# Patient Record
Sex: Male | Born: 1945 | Race: Black or African American | Hispanic: No | Marital: Single | Smoking: Never smoker
Health system: Southern US, Community
[De-identification: ages and names within clinical notes are randomized; demographics above are authoritative.]

## PROBLEM LIST (undated history)

## (undated) DIAGNOSIS — I82409 Acute embolism and thrombosis of unspecified deep veins of unspecified lower extremity: Secondary | ICD-10-CM

## (undated) DIAGNOSIS — E785 Hyperlipidemia, unspecified: Secondary | ICD-10-CM

## (undated) DIAGNOSIS — C61 Malignant neoplasm of prostate: Secondary | ICD-10-CM

## (undated) DIAGNOSIS — I10 Essential (primary) hypertension: Secondary | ICD-10-CM

## (undated) DIAGNOSIS — E119 Type 2 diabetes mellitus without complications: Secondary | ICD-10-CM

## (undated) DIAGNOSIS — K449 Diaphragmatic hernia without obstruction or gangrene: Secondary | ICD-10-CM

## (undated) HISTORY — PX: KIDNEY STONE SURGERY: SHX686

## (undated) HISTORY — PX: PROSTATE SURGERY: SHX751

## (undated) HISTORY — PX: THROAT SURGERY: SHX803

## (undated) HISTORY — PX: PENILE PROSTHESIS IMPLANT: SHX240

## (undated) HISTORY — PX: CYST EXCISION: SHX5701

---

## 2012-10-03 ENCOUNTER — Encounter (HOSPITAL_BASED_OUTPATIENT_CLINIC_OR_DEPARTMENT_OTHER): Payer: Self-pay

## 2012-10-03 ENCOUNTER — Emergency Department (HOSPITAL_BASED_OUTPATIENT_CLINIC_OR_DEPARTMENT_OTHER)
Admission: EM | Admit: 2012-10-03 | Discharge: 2012-10-03 | Disposition: A | Discharge status: Home / Self Care | Payer: Medicare Other | Attending: Emergency Medicine | Admitting: Emergency Medicine

## 2012-10-03 DIAGNOSIS — Z79899 Other long term (current) drug therapy: Secondary | ICD-10-CM | POA: Insufficient documentation

## 2012-10-03 DIAGNOSIS — I10 Essential (primary) hypertension: Secondary | ICD-10-CM | POA: Insufficient documentation

## 2012-10-03 DIAGNOSIS — Z8546 Personal history of malignant neoplasm of prostate: Secondary | ICD-10-CM | POA: Insufficient documentation

## 2012-10-03 DIAGNOSIS — R300 Dysuria: Secondary | ICD-10-CM | POA: Insufficient documentation

## 2012-10-03 DIAGNOSIS — Z86718 Personal history of other venous thrombosis and embolism: Secondary | ICD-10-CM | POA: Insufficient documentation

## 2012-10-03 DIAGNOSIS — Z8639 Personal history of other endocrine, nutritional and metabolic disease: Secondary | ICD-10-CM | POA: Insufficient documentation

## 2012-10-03 DIAGNOSIS — Z8719 Personal history of other diseases of the digestive system: Secondary | ICD-10-CM | POA: Insufficient documentation

## 2012-10-03 DIAGNOSIS — Z862 Personal history of diseases of the blood and blood-forming organs and certain disorders involving the immune mechanism: Secondary | ICD-10-CM | POA: Insufficient documentation

## 2012-10-03 DIAGNOSIS — Z87442 Personal history of urinary calculi: Secondary | ICD-10-CM | POA: Insufficient documentation

## 2012-10-03 DIAGNOSIS — Z7901 Long term (current) use of anticoagulants: Secondary | ICD-10-CM | POA: Insufficient documentation

## 2012-10-03 HISTORY — DX: Essential (primary) hypertension: I10

## 2012-10-03 HISTORY — DX: Malignant neoplasm of prostate: C61

## 2012-10-03 HISTORY — DX: Hyperlipidemia, unspecified: E78.5

## 2012-10-03 HISTORY — DX: Acute embolism and thrombosis of unspecified deep veins of unspecified lower extremity: I82.409

## 2012-10-03 HISTORY — DX: Diaphragmatic hernia without obstruction or gangrene: K44.9

## 2012-10-03 LAB — URINALYSIS, ROUTINE W REFLEX MICROSCOPIC
Bilirubin Urine: NEGATIVE
Glucose, UA: NEGATIVE mg/dL
Hgb urine dipstick: NEGATIVE
Ketones, ur: NEGATIVE mg/dL
Nitrite: NEGATIVE
Specific Gravity, Urine: 1.007 (ref 1.005–1.030)
pH: 6 (ref 5.0–8.0)

## 2012-10-03 NOTE — ED Provider Notes (Signed)
Medical screening examination/treatment/procedure(s) were performed by non-physician practitioner and as supervising physician I was immediately available for consultation/collaboration.   Loren Racer, MD 10/03/12 [phone]

## 2012-10-03 NOTE — ED Notes (Signed)
Pt states that he has undergone radiation treatments for prostate ca, has been having dysuria with end of urination.  Pt states that he has been seen by PCP but symptoms are not improving.

## 2012-10-03 NOTE — ED Provider Notes (Signed)
History     CSN: [id number]  Arrival date & time 10/03/12  1123   First MD Initiated Contact with Patient 10/03/12 1208      Chief Complaint  Patient presents with  . Dysuria    (Consider location/radiation/quality/duration/timing/severity/associated sxs/prior treatment) Patient is a 67 y.o. male presenting with dysuria. The history is provided by the patient. No language interpreter was used.  Dysuria  This is a new problem. The current episode started more than 1 week ago. The problem has not changed since onset.The quality of the pain is described as aching. The pain is moderate. There has been no fever. There is no history of pyelonephritis. Pertinent negatives include no vomiting, no discharge, no frequency, no urgency and no flank pain. He has tried nothing for the symptoms. His past medical history is significant for kidney stones. His past medical history does not include recurrent UTIs.  Pt had radiation treatments for prostate ca.   Pt reports a stinging sensation at the end of urination.  No retention,  No discharge.  Past Medical History  Diagnosis Date  . Prostate cancer   . Hypertension   . Hyperlipidemia   . Hiatal hernia   . DVT (deep venous thrombosis)     Past Surgical History  Procedure Laterality Date  . Prostate surgery    . Cyst excision      from vocal cords    History reviewed. No pertinent family history.  History  Substance Use Topics  . Smoking status: Never Smoker   . Smokeless tobacco: Never Used  . Alcohol Use: No      Review of Systems  Gastrointestinal: Negative for vomiting.  Genitourinary: Positive for dysuria. Negative for urgency, frequency and flank pain.  All other systems reviewed and are negative.    Allergies  Review of patient's allergies indicates no known allergies.  Home Medications   Current Outpatient Rx  Name  Route  Sig  Dispense  Refill  . chlorthalidone (HYGROTON) 25 MG tablet   Oral   Take 25 mg by  mouth daily.         Marland Kitchen diltiazem (CARDIZEM) 90 MG tablet   Oral   Take 90 mg by mouth 2 (two) times daily.         Marland Kitchen esomeprazole (NEXIUM) 40 MG capsule   Oral   Take 40 mg by mouth 2 (two) times daily.         . fish oil-omega-3 fatty acids 1000 MG capsule   Oral   Take 2 g by mouth daily.         Marland Kitchen loratadine (CLARITIN) 10 MG tablet   Oral   Take 10 mg by mouth daily.         . metoprolol (LOPRESSOR) 100 MG tablet   Oral   Take 100 mg by mouth 2 (two) times daily.         . potassium chloride SA (K-DUR,KLOR-CON) 20 MEQ tablet   Oral   Take 40 mEq by mouth 2 (two) times daily.         . Tamsulosin HCl (FLOMAX) 0.4 MG CAPS   Oral   Take 0.4 mg by mouth at bedtime.         . traMADol (ULTRAM) 50 MG tablet   Oral   Take 50 mg by mouth every 8 (eight) hours as needed for pain.         Marland Kitchen warfarin (COUMADIN) 5 MG tablet   Oral  Take by mouth daily. Take one and half tablets Monday, Wednesday, and Friday, then one tablet all other days.           BP 134/83  Pulse 54  Temp(Src) 97.9 F (36.6 C) (Oral)  Ht 5\' 11"  (1.803 m)  Wt 244 lb (110.678 kg)  BMI 34.05 kg/m2  SpO2 99%  Physical Exam  Nursing note and vitals reviewed. Constitutional: He is oriented to person, place, and time. He appears well-developed and well-nourished.  HENT:  Head: Normocephalic.  Right Ear: External ear normal.  Left Ear: External ear normal.  Nose: Nose normal.  Mouth/Throat: Oropharynx is clear and moist.  Eyes: Conjunctivae and EOM are normal. Pupils are equal, round, and reactive to light.  Neck: Normal range of motion. Neck supple.  Cardiovascular: Normal rate and normal heart sounds.   Pulmonary/Chest: Effort normal.  Abdominal: Soft.  Musculoskeletal: Normal range of motion.  Neurological: He is alert and oriented to person, place, and time. He has normal reflexes.  Skin: Skin is warm.  Psychiatric: He has a normal mood and affect.    ED Course   Procedures (including critical care time)  Labs Reviewed  URINALYSIS, ROUTINE W REFLEX MICROSCOPIC   No results found.   1. Dysuria       MDM  I discussed with pt,  No uti,   I advised him to see his MD this week for recheck.  (I suspect nerve pain,   I advised pt that there is some pain with nerve healing after raditation)        Elson Areas, PA 10/03/12 1317  Lonia Skinner [place], [place] 10/03/12 1317

## 2013-06-03 ENCOUNTER — Ambulatory Visit (INDEPENDENT_AMBULATORY_CARE_PROVIDER_SITE_OTHER): Payer: PRIVATE HEALTH INSURANCE | Admitting: Podiatry

## 2013-06-03 ENCOUNTER — Encounter: Payer: Self-pay | Admitting: Podiatry

## 2013-06-03 VITALS — BP 115/81 | HR 65 | Ht 71.0 in | Wt 223.0 lb

## 2013-06-03 DIAGNOSIS — M79671 Pain in right foot: Secondary | ICD-10-CM

## 2013-06-03 DIAGNOSIS — M21969 Unspecified acquired deformity of unspecified lower leg: Secondary | ICD-10-CM

## 2013-06-03 DIAGNOSIS — M79609 Pain in unspecified limb: Secondary | ICD-10-CM

## 2013-06-03 NOTE — Progress Notes (Signed)
Pain on left foot arch area for over 2 weeks. On feet while working part time work, 4 hours a day.  Taking Coumadin since having blood clot on left leg.  Review of Systems - General ROS: negative for - chills, fatigue, fever, night sweats, sleep disturbance, weight gain or weight loss Ophthalmic ROS: negative ENT ROS: Has had cyst removed from vocal cord 3 years ago. Respiratory ROS: no cough, shortness of breath, or wheezing Cardiovascular ROS: no chest pain or dyspnea on exertion Gastrointestinal ROS: no abdominal pain, change in bowel habits, or black or bloody stools Genito-Urinary ROS: Recovered from Prostate cancer. Musculoskeletal ROS: Left knee joint is tight. Dermatological ROS: Normal skin excetp thick dystrophic nails on both great toes.  Objective: Vascular: All pedal pulses are palpable. Left leg is still slightly larger, which had blood clot 3 years ago.  No acute edema or erythema noted.  Dermatologic: Normotrophic skin with deformed hallucal nail bilateral. No open lesions noted. Neurologic: All epicritic and tactile sensations grossly intact. Orthopedic: Hallux valgus with bunion present bilateral. Forefoot varus with elevated first ray. Lateral weight shifting on left with joint pain. Radiographic examination reveal severe dorsal displacement of the first ray bilateral. Abducted hallux with enlarged medial eminence of the first Metatarsal bones bilateral.  Assessment: Lesser metatarsalgia left foot. Hallux valgus with bunion bilateral. Sagittal plane deformity first Metatarsal bilateral.  Plan:  Reviewed clinical findings and available treatment options. Will continue with Tramadol.

## 2013-06-03 NOTE — Patient Instructions (Addendum)
Seen for pain on left foot. X-ray show severe case of misalignment of foot bones. Condition can be helped by taking Antiinflammatory medication like Advil, custom orthotics, and changing activities (less on feet). Surgical option is also available. Continue with Tramadol as needed.

## 2013-06-07 DIAGNOSIS — M21969 Unspecified acquired deformity of unspecified lower leg: Secondary | ICD-10-CM | POA: Insufficient documentation

## 2013-06-07 DIAGNOSIS — M79671 Pain in right foot: Secondary | ICD-10-CM | POA: Insufficient documentation

## 2014-07-12 ENCOUNTER — Emergency Department (HOSPITAL_BASED_OUTPATIENT_CLINIC_OR_DEPARTMENT_OTHER): Payer: Medicare Other

## 2014-07-12 ENCOUNTER — Emergency Department (HOSPITAL_BASED_OUTPATIENT_CLINIC_OR_DEPARTMENT_OTHER)
Admission: EM | Admit: 2014-07-12 | Discharge: 2014-07-12 | Disposition: A | Discharge status: Home / Self Care | Payer: Medicare Other | Attending: Emergency Medicine | Admitting: Emergency Medicine

## 2014-07-12 ENCOUNTER — Encounter (HOSPITAL_BASED_OUTPATIENT_CLINIC_OR_DEPARTMENT_OTHER): Payer: Self-pay | Admitting: Emergency Medicine

## 2014-07-12 DIAGNOSIS — Z86718 Personal history of other venous thrombosis and embolism: Secondary | ICD-10-CM | POA: Insufficient documentation

## 2014-07-12 DIAGNOSIS — E785 Hyperlipidemia, unspecified: Secondary | ICD-10-CM | POA: Diagnosis not present

## 2014-07-12 DIAGNOSIS — Z9889 Other specified postprocedural states: Secondary | ICD-10-CM | POA: Diagnosis not present

## 2014-07-12 DIAGNOSIS — Z7901 Long term (current) use of anticoagulants: Secondary | ICD-10-CM | POA: Diagnosis not present

## 2014-07-12 DIAGNOSIS — R1084 Generalized abdominal pain: Secondary | ICD-10-CM | POA: Diagnosis not present

## 2014-07-12 DIAGNOSIS — I10 Essential (primary) hypertension: Secondary | ICD-10-CM | POA: Insufficient documentation

## 2014-07-12 DIAGNOSIS — K5900 Constipation, unspecified: Secondary | ICD-10-CM | POA: Insufficient documentation

## 2014-07-12 DIAGNOSIS — Z7952 Long term (current) use of systemic steroids: Secondary | ICD-10-CM | POA: Insufficient documentation

## 2014-07-12 DIAGNOSIS — Z8546 Personal history of malignant neoplasm of prostate: Secondary | ICD-10-CM | POA: Insufficient documentation

## 2014-07-12 DIAGNOSIS — Z79899 Other long term (current) drug therapy: Secondary | ICD-10-CM | POA: Diagnosis not present

## 2014-07-12 DIAGNOSIS — R109 Unspecified abdominal pain: Secondary | ICD-10-CM

## 2014-07-12 LAB — CBC WITH DIFFERENTIAL/PLATELET
BASOS ABS: 0 10*3/uL (ref 0.0–0.1)
Basophils Relative: 0 % (ref 0–1)
EOS PCT: 1 % (ref 0–5)
Eosinophils Absolute: 0 10*3/uL (ref 0.0–0.7)
HCT: 34.9 % — ABNORMAL LOW (ref 39.0–52.0)
Hemoglobin: 10.9 g/dL — ABNORMAL LOW (ref 13.0–17.0)
LYMPHS PCT: 22 % (ref 12–46)
Lymphs Abs: 0.8 10*3/uL (ref 0.7–4.0)
MCH: 24.7 pg — ABNORMAL LOW (ref 26.0–34.0)
MCHC: 31.2 g/dL (ref 30.0–36.0)
MCV: 79.1 fL (ref 78.0–100.0)
Monocytes Absolute: 0.4 10*3/uL (ref 0.1–1.0)
Monocytes Relative: 11 % (ref 3–12)
NEUTROS ABS: 2.4 10*3/uL (ref 1.7–7.7)
Neutrophils Relative %: 66 % (ref 43–77)
PLATELETS: 128 10*3/uL — AB (ref 150–400)
RBC: 4.41 MIL/uL (ref 4.22–5.81)
RDW: 14.9 % (ref 11.5–15.5)
WBC: 3.7 10*3/uL — AB (ref 4.0–10.5)

## 2014-07-12 LAB — COMPREHENSIVE METABOLIC PANEL
ALBUMIN: 3.3 g/dL — AB (ref 3.5–5.2)
ALT: 13 U/L (ref 0–53)
AST: 17 U/L (ref 0–37)
Alkaline Phosphatase: 71 U/L (ref 39–117)
Anion gap: 9 (ref 5–15)
BUN: 8 mg/dL (ref 6–23)
CALCIUM: 8.3 mg/dL — AB (ref 8.4–10.5)
CO2: 26 meq/L (ref 19–32)
Chloride: 104 mEq/L (ref 96–112)
Creatinine, Ser: 1.4 mg/dL — ABNORMAL HIGH (ref 0.50–1.35)
GFR calc Af Amer: 58 mL/min — ABNORMAL LOW (ref >90)
GFR, EST NON AFRICAN AMERICAN: 50 mL/min — AB (ref >90)
Glucose, Bld: 111 mg/dL — ABNORMAL HIGH (ref 70–99)
Potassium: 4.6 mEq/L (ref 3.7–5.3)
SODIUM: 139 meq/L (ref 137–147)
Total Bilirubin: 0.6 mg/dL (ref 0.3–1.2)
Total Protein: 6.8 g/dL (ref 6.0–8.3)

## 2014-07-12 LAB — URINALYSIS, ROUTINE W REFLEX MICROSCOPIC
Bilirubin Urine: NEGATIVE
GLUCOSE, UA: NEGATIVE mg/dL
Hgb urine dipstick: NEGATIVE
KETONES UR: NEGATIVE mg/dL
LEUKOCYTES UA: NEGATIVE
Nitrite: NEGATIVE
PH: 6 (ref 5.0–8.0)
Protein, ur: NEGATIVE mg/dL
SPECIFIC GRAVITY, URINE: 1.021 (ref 1.005–1.030)
Urobilinogen, UA: 0.2 mg/dL (ref 0.0–1.0)

## 2014-07-12 LAB — LIPASE, BLOOD: Lipase: 31 U/L (ref 11–59)

## 2014-07-12 LAB — TROPONIN I

## 2014-07-12 LAB — I-STAT CG4 LACTIC ACID, ED: Lactic Acid, Venous: 1.07 mmol/L (ref 0.5–2.2)

## 2014-07-12 LAB — PROTIME-INR
INR: 2.69 — AB (ref 0.00–1.49)
PROTHROMBIN TIME: 28.6 s — AB (ref 11.6–15.2)

## 2014-07-12 LAB — OCCULT BLOOD X 1 CARD TO LAB, STOOL: FECAL OCCULT BLD: NEGATIVE

## 2014-07-12 MED ORDER — DOCUSATE SODIUM 100 MG PO CAPS: 100.0000 mg | ORAL_CAPSULE | Freq: Two times a day (BID) | ORAL | Status: AC

## 2014-07-12 MED ORDER — IOHEXOL 300 MG/ML  SOLN
25.0000 mL | Freq: Once | INTRAMUSCULAR | Status: AC | PRN
  Administered 2014-07-12: 25 mL via ORAL

## 2014-07-12 MED ORDER — IOHEXOL 300 MG/ML  SOLN
100.0000 mL | Freq: Once | INTRAMUSCULAR | Status: AC | PRN
  Administered 2014-07-12: 100 mL via INTRAVENOUS

## 2014-07-12 MED ORDER — DICYCLOMINE HCL 20 MG PO TABS: 20.0000 mg | ORAL_TABLET | Freq: Two times a day (BID) | ORAL | Status: AC

## 2014-07-12 NOTE — Discharge Instructions (Signed)

## 2014-07-12 NOTE — ED Notes (Signed)
Pt made aware that urine specimen is needed at this time.

## 2014-07-12 NOTE — ED Notes (Signed)
Patient transported to CT 

## 2014-07-12 NOTE — ED Notes (Signed)
Abdominal pain since last night.  No N/V/D.  Pt feels constipated.

## 2014-07-12 NOTE — ED Provider Notes (Signed)
CSN: [id number]     Arrival date & time 07/12/14  8416 History   First MD Initiated Contact with Patient 07/12/14 0848     Chief Complaint  Patient presents with  . Abdominal Pain     (Consider location/radiation/quality/duration/timing/severity/associated sxs/prior Treatment) HPI Comments: Patient presents to the ER for evaluation of abdominal pain and distention. Patient reports that he started to have discomfort last night. He feels like he is constipated, feels like he needs to have a bowel movement but cannot go. Abdomen is "bloated". He has not had any fever, nausea, vomiting. There has not been any diarrhea.  Patient is a 68 y.o. male presenting with abdominal pain.  Abdominal Pain Associated symptoms: constipation     Past Medical History  Diagnosis Date  . Prostate cancer   . Hypertension   . Hyperlipidemia   . Hiatal hernia   . DVT (deep venous thrombosis)    Past Surgical History  Procedure Laterality Date  . Prostate surgery    . Cyst excision      from vocal cords   No family history on file. History  Substance Use Topics  . Smoking status: Never Smoker   . Smokeless tobacco: Never Used  . Alcohol Use: No    Review of Systems  Gastrointestinal: Positive for abdominal pain, constipation and abdominal distention.  All other systems reviewed and are negative.     Allergies  Review of patient's allergies indicates no known allergies.  Home Medications   Prior to Admission medications   Medication Sig Start Date End Date Taking? Authorizing Provider  atorvastatin (LIPITOR) 10 MG tablet Take 10 mg by mouth daily.   Yes Historical Provider, MD  Cetirizine HCl 10 MG CAPS Take 1 capsule by mouth daily.   Yes Historical Provider, MD  chlorthalidone (HYGROTON) 25 MG tablet Take 25 mg by mouth daily.   Yes Historical Provider, MD  cyanocobalamin 500 MCG tablet Take 500 mcg by mouth daily.   Yes Historical Provider, MD  esomeprazole (NEXIUM) 40 MG capsule  Take 40 mg by mouth 2 (two) times daily.   Yes Historical Provider, MD  fluticasone (FLONASE) 50 MCG/ACT nasal spray Place 2 sprays into both nostrils daily.   Yes Historical Provider, MD  metoprolol (LOPRESSOR) 100 MG tablet Take 100 mg by mouth 2 (two) times daily.   Yes Historical Provider, MD  oxyCODONE-acetaminophen (PERCOCET) 10-325 MG per tablet Take 1 tablet by mouth every 6 (six) hours as needed for pain.   Yes Historical Provider, MD  potassium chloride SA (K-DUR,KLOR-CON) 20 MEQ tablet Take 40 mEq by mouth 2 (two) times daily.   Yes Historical Provider, MD  traMADol (ULTRAM) 50 MG tablet Take 50 mg by mouth every 8 (eight) hours as needed for pain.   Yes Historical Provider, MD  warfarin (COUMADIN) 5 MG tablet Take by mouth daily. Take one and half tablets Monday, Wednesday, and Friday, then one tablet all other days.   Yes Historical Provider, MD  dicyclomine (BENTYL) 20 MG tablet Take 1 tablet (20 mg total) by mouth 2 (two) times daily. 07/12/14   Orpah Greek, MD  docusate sodium (COLACE) 100 MG capsule Take 1 capsule (100 mg total) by mouth every 12 (twelve) hours. 07/12/14   Orpah Greek, MD   BP 152/76 mmHg  Pulse 52  Temp(Src) 98.6 F (37 C) (Oral)  Resp 16  Ht 5\' 11"  (1.803 m)  Wt 248 lb (112.492 kg)  BMI 34.60 kg/m2  SpO2 100% Physical  Exam  Constitutional: He is oriented to person, place, and time. He appears well-developed and well-nourished. No distress.  HENT:  Head: Normocephalic and atraumatic.  Right Ear: Hearing normal.  Left Ear: Hearing normal.  Nose: Nose normal.  Mouth/Throat: Oropharynx is clear and moist and mucous membranes are normal.  Eyes: Conjunctivae and EOM are normal. Pupils are equal, round, and reactive to light.  Neck: Normal range of motion. Neck supple.  Cardiovascular: Regular rhythm, S1 normal and S2 normal.  Exam reveals no gallop and no friction rub.   No murmur heard. Pulmonary/Chest: Effort normal and breath  sounds normal. No respiratory distress. He exhibits no tenderness.  Abdominal: Soft. Normal appearance and bowel sounds are normal. He exhibits distension. There is no hepatosplenomegaly. There is generalized tenderness. There is no rebound, no guarding, no tenderness at McBurney's point and negative Murphy's sign. No hernia.  Genitourinary: Rectal exam shows no external hemorrhoid, no internal hemorrhoid, no mass, no tenderness and anal tone normal.  Musculoskeletal: Normal range of motion.  Neurological: He is alert and oriented to person, place, and time. He has normal strength. No cranial nerve deficit or sensory deficit. Coordination normal. GCS eye subscore is 4. GCS verbal subscore is 5. GCS motor subscore is 6.  Skin: Skin is warm, dry and intact. No rash noted. No cyanosis.  Psychiatric: He has a normal mood and affect. His speech is normal and behavior is normal. Thought content normal.  Nursing note and vitals reviewed.   ED Course  Procedures (including critical care time) Labs Review Labs Reviewed  CBC WITH DIFFERENTIAL - Abnormal; Notable for the following:    WBC 3.7 (*)    Hemoglobin 10.9 (*)    HCT 34.9 (*)    MCH 24.7 (*)    Platelets 128 (*)    All other components within normal limits  COMPREHENSIVE METABOLIC PANEL - Abnormal; Notable for the following:    Glucose, Bld 111 (*)    Creatinine, Ser 1.40 (*)    Calcium 8.3 (*)    Albumin 3.3 (*)    GFR calc non Af Amer 50 (*)    GFR calc Af Amer 58 (*)    All other components within normal limits  PROTIME-INR - Abnormal; Notable for the following:    Prothrombin Time 28.6 (*)    INR 2.69 (*)    All other components within normal limits  LIPASE, BLOOD  URINALYSIS, ROUTINE W REFLEX MICROSCOPIC  TROPONIN I  OCCULT BLOOD X 1 CARD TO LAB, STOOL  LACTIC ACID, PLASMA  I-STAT CG4 LACTIC ACID, ED    Imaging Review Ct Abdomen Pelvis W Contrast  07/12/2014   CLINICAL DATA:  Upper abdominal pain since 1 am, initial  encounter  EXAM: CT ABDOMEN AND PELVIS WITH CONTRAST  TECHNIQUE: Multidetector CT imaging of the abdomen and pelvis was performed using the standard protocol following bolus administration of intravenous contrast.  CONTRAST:  178mL OMNIPAQUE IOHEXOL 300 MG/ML SOLN, 5mL OMNIPAQUE IOHEXOL 300 MG/ML SOLN  COMPARISON:  03/12/2013  FINDINGS: The lung bases are free of acute infiltrate or sizable effusion. Stable nodules are noted in the right base consistent with a more benign etiology. A small hiatal hernia is noted.  The liver, spleen, adrenal glands and kidneys are within normal limits. The gallbladder is been surgically removed. The pancreas is well visualized and within normal limits. Multiple small lymph nodes and diffuse mesenteric edema is identified. The overall appearance is similar to that seen on prior exam as well  an exam from 2011.  The penile prosthesis is again noted with the reservoir in the left mid abdomen. The appendix is well visualized. The bladder is well distended. The previously seen postoperative changes in the pelvis have resolved. Soft tissue density is again noted adjacent to the left common femoral artery measuring 2.6 by 2.3 cm. It is stable since 2011 and likely represents a more benign abnormality. No significant pelvic or retroperitoneal adenopathy is seen. The bony structures show no acute abnormality.  IMPRESSION: Stable right lower lobe nodules.  Stable edematous changes in the mesenteric with small lymph nodes.  Stable soft tissue density adjacent to the left common femoral artery.  No acute abnormality is seen.   Electronically Signed   By: Inez Catalina M.D.   On: 07/12/2014 11:11     EKG Interpretation   Date/Time:  Wednesday July 12 2014 09:09:44 EST Ventricular Rate:  52 PR Interval:  236 QRS Duration: 86 QT Interval:  418 QTC Calculation: 388 R Axis:   -2 Text Interpretation:  Sinus bradycardia with 1st degree A-V block  Nonspecific T wave abnormality Abnormal  ECG No previous tracing Confirmed  by POLLINA  MD, CHRISTOPHER [phone]) on 07/12/2014 9:14:12 AM      MDM   Final diagnoses:  Abdominal pain, acute    Presents to the ER for evaluation of abdominal pain. Patient reports inability to have a bowel movement since yesterday. He has mild distention of his abdomen. Exam revealed no significant tenderness, no guarding or rebound. Lab work was entirely normal. CT abdomen and pelvis performed, no acute pathology. Patient treated with Bentyl, Colace, follow-up with primary doctor.   Orpah Greek, MD 07/12/14 [phone]

## 2015-05-21 ENCOUNTER — Encounter (HOSPITAL_BASED_OUTPATIENT_CLINIC_OR_DEPARTMENT_OTHER): Payer: Self-pay | Admitting: Emergency Medicine

## 2015-05-21 ENCOUNTER — Emergency Department (HOSPITAL_BASED_OUTPATIENT_CLINIC_OR_DEPARTMENT_OTHER)
Admission: EM | Admit: 2015-05-21 | Discharge: 2015-05-21 | Disposition: A | Discharge status: Home / Self Care | Payer: Medicare Other | Attending: Emergency Medicine | Admitting: Emergency Medicine

## 2015-05-21 ENCOUNTER — Emergency Department (HOSPITAL_BASED_OUTPATIENT_CLINIC_OR_DEPARTMENT_OTHER): Payer: Medicare Other

## 2015-05-21 DIAGNOSIS — Z79899 Other long term (current) drug therapy: Secondary | ICD-10-CM | POA: Diagnosis not present

## 2015-05-21 DIAGNOSIS — Z8719 Personal history of other diseases of the digestive system: Secondary | ICD-10-CM | POA: Diagnosis not present

## 2015-05-21 DIAGNOSIS — Z7951 Long term (current) use of inhaled steroids: Secondary | ICD-10-CM | POA: Diagnosis not present

## 2015-05-21 DIAGNOSIS — I10 Essential (primary) hypertension: Secondary | ICD-10-CM | POA: Diagnosis not present

## 2015-05-21 DIAGNOSIS — Y939 Activity, unspecified: Secondary | ICD-10-CM | POA: Insufficient documentation

## 2015-05-21 DIAGNOSIS — S3991XA Unspecified injury of abdomen, initial encounter: Secondary | ICD-10-CM | POA: Diagnosis present

## 2015-05-21 DIAGNOSIS — S39012A Strain of muscle, fascia and tendon of lower back, initial encounter: Secondary | ICD-10-CM | POA: Diagnosis not present

## 2015-05-21 DIAGNOSIS — E785 Hyperlipidemia, unspecified: Secondary | ICD-10-CM | POA: Insufficient documentation

## 2015-05-21 DIAGNOSIS — Y999 Unspecified external cause status: Secondary | ICD-10-CM | POA: Diagnosis not present

## 2015-05-21 DIAGNOSIS — Z8546 Personal history of malignant neoplasm of prostate: Secondary | ICD-10-CM | POA: Insufficient documentation

## 2015-05-21 DIAGNOSIS — R52 Pain, unspecified: Secondary | ICD-10-CM

## 2015-05-21 DIAGNOSIS — Y929 Unspecified place or not applicable: Secondary | ICD-10-CM | POA: Insufficient documentation

## 2015-05-21 DIAGNOSIS — E119 Type 2 diabetes mellitus without complications: Secondary | ICD-10-CM | POA: Insufficient documentation

## 2015-05-21 DIAGNOSIS — X58XXXA Exposure to other specified factors, initial encounter: Secondary | ICD-10-CM | POA: Insufficient documentation

## 2015-05-21 DIAGNOSIS — Z86718 Personal history of other venous thrombosis and embolism: Secondary | ICD-10-CM | POA: Insufficient documentation

## 2015-05-21 HISTORY — DX: Type 2 diabetes mellitus without complications: E11.9

## 2015-05-21 LAB — URINALYSIS, ROUTINE W REFLEX MICROSCOPIC
Bilirubin Urine: NEGATIVE
GLUCOSE, UA: NEGATIVE mg/dL
HGB URINE DIPSTICK: NEGATIVE
Ketones, ur: NEGATIVE mg/dL
Leukocytes, UA: NEGATIVE
Nitrite: NEGATIVE
PH: 5.5 (ref 5.0–8.0)
PROTEIN: NEGATIVE mg/dL
SPECIFIC GRAVITY, URINE: 1.026 (ref 1.005–1.030)
Urobilinogen, UA: 1 mg/dL (ref 0.0–1.0)

## 2015-05-21 MED ORDER — CYCLOBENZAPRINE HCL 10 MG PO TABS: 10.0000 mg | ORAL_TABLET | Freq: Two times a day (BID) | ORAL | Status: DC | PRN

## 2015-05-21 NOTE — ED Notes (Signed)
MD at bedside. 

## 2015-05-21 NOTE — ED Notes (Signed)
Pt c/o soreness above right hip when he lifts his leg to put it in his pants.  Pt denies pain with walking, denies dysuria, has full ROM.  Pain started Saturday.  He denies injury or strain.

## 2015-05-21 NOTE — ED Provider Notes (Addendum)
CSN: [id number]     Arrival date & time 05/21/15  1910 History  By signing my name below, I, Soijett Blue, attest that this documentation has been prepared under the direction and in the presence of Blanchie Dessert, MD. Electronically Signed: Soijett Blue, ED Scribe. 05/21/2015. 7:52 PM.   Chief Complaint  Patient presents with  . Flank Pain      The history is provided by the patient. No language interpreter was used.     HPI Comments: Donald Zuniga. is a 69 y.o. male who presents to the Emergency Department complaining of right flank soreness onset 2 days. He denies any heavy lifting, trauma, or injury to the area. He reports that there is pain when he lifts his leg to place it in his pants. He denies the pain being worsened after eating. He states that he has tried oxycodone this morning with significant relief for his symptoms. He denies numbness, gait problem, right hip pain, urinary symptoms, hematuria, vomiting, rash, abdominal pain, and any other symptoms. He reports that he has prostate CA 26 years ago that has not came back. He notes that he goes to his PCP and oncologist q 3 months.   Past Medical History  Diagnosis Date  . Prostate cancer ([place])   . Hypertension   . Hyperlipidemia   . Hiatal hernia   . DVT (deep venous thrombosis) ([place])   . Diabetes mellitus without complication [facility])    Past Surgical History  Procedure Laterality Date  . Prostate surgery    . Cyst excision      from vocal cords  . Penile prosthesis implant    . Kidney stone surgery    . Throat surgery     No family history on file. Social History  Substance Use Topics  . Smoking status: Never Smoker   . Smokeless tobacco: Never Used  . Alcohol Use: Yes     Comment: rarely    Review of Systems  Gastrointestinal: Negative for vomiting and abdominal pain.  Genitourinary: Positive for flank pain. Negative for dysuria, hematuria and difficulty urinating.  Musculoskeletal: Negative for  arthralgias and gait problem.  Skin: Negative for rash.  Neurological: Negative for numbness.      Allergies  Review of patient's allergies indicates no known allergies.  Home Medications   Prior to Admission medications   Medication Sig Start Date End Date Taking? Authorizing Provider  atorvastatin (LIPITOR) 10 MG tablet Take 10 mg by mouth daily.    Historical Provider, MD  Cetirizine HCl 10 MG CAPS Take 1 capsule by mouth daily.    Historical Provider, MD  chlorthalidone (HYGROTON) 25 MG tablet Take 25 mg by mouth daily.    Historical Provider, MD  cyanocobalamin 500 MCG tablet Take 500 mcg by mouth daily.    Historical Provider, MD  dicyclomine (BENTYL) 20 MG tablet Take 1 tablet (20 mg total) by mouth 2 (two) times daily. 07/12/14   Orpah Greek, MD  docusate sodium (COLACE) 100 MG capsule Take 1 capsule (100 mg total) by mouth every 12 (twelve) hours. 07/12/14   Orpah Greek, MD  esomeprazole (NEXIUM) 40 MG capsule Take 40 mg by mouth 2 (two) times daily.    Historical Provider, MD  fluticasone (FLONASE) 50 MCG/ACT nasal spray Place 2 sprays into both nostrils daily.    Historical Provider, MD  metoprolol (LOPRESSOR) 100 MG tablet Take 100 mg by mouth 2 (two) times daily.    Historical Provider, MD  oxyCODONE-acetaminophen (PERCOCET)  10-325 MG per tablet Take 1 tablet by mouth every 6 (six) hours as needed for pain.    Historical Provider, MD  potassium chloride SA (K-DUR,KLOR-CON) 20 MEQ tablet Take 40 mEq by mouth 2 (two) times daily.    Historical Provider, MD  traMADol (ULTRAM) 50 MG tablet Take 50 mg by mouth every 8 (eight) hours as needed for pain.    Historical Provider, MD  warfarin (COUMADIN) 5 MG tablet Take by mouth daily. Take one and half tablets Monday, Wednesday, and Friday, then one tablet all other days.    Historical Provider, MD   BP 113/70 mmHg  Pulse 64  Temp(Src) 98.2 F (36.8 C)  Resp 16  Ht 5\' 11"  (1.803 m)  Wt 252 lb (114.306 kg)   BMI 35.16 kg/m2  SpO2 99% Physical Exam  Constitutional: He is oriented to person, place, and time. He appears well-developed and well-nourished. No distress.  HENT:  Head: Normocephalic and atraumatic.  Eyes: EOM are normal.  Neck: Neck supple.  Cardiovascular: Normal rate.   Pulses:      Dorsalis pedis pulses are 2+ on the right side.  Pulmonary/Chest: Effort normal. No respiratory distress.  Abdominal: Soft. There is no tenderness.  No flank tenderness  Musculoskeletal: Normal range of motion.       Back:  Mild tenderness of the para lumbar muscles over the iliac crest. Pain reproduced with palpation and extreme flexion of right hip. No spinal tenderness or right hip tenderness. 2+dp pulses in right foot. Nl sensation and no swelling  Neurological: He is alert and oriented to person, place, and time.  Skin: Skin is warm and dry. No rash noted.  Psychiatric: He has a normal mood and affect. His behavior is normal.  Nursing note and vitals reviewed.   ED Course  Procedures (including critical care time) DIAGNOSTIC STUDIES: Oxygen Saturation is 99% on RA, nl by my interpretation.    COORDINATION OF CARE: 7:52 PM Discussed treatment plan with pt at bedside which includes UA and pt agreed to plan.    Labs Review Labs Reviewed  URINALYSIS, ROUTINE W REFLEX MICROSCOPIC (NOT AT [facility]) - Abnormal; Notable for the following:    APPearance CLOUDY (*)    All other components within normal limits    Imaging Review Dg Hip Unilat With Pelvis 2-3 Views Right  05/21/2015   CLINICAL DATA:  Nontraumatic right hip pain for 2 days.  EXAM: DG HIP (WITH OR WITHOUT PELVIS) 2-3V RIGHT  COMPARISON:  None.  FINDINGS: There is no evidence of hip fracture or dislocation. There is no evidence of arthropathy or other focal bone abnormality.  IMPRESSION: Negative.   Electronically Signed   By: Andreas Newport M.D.   On: 05/21/2015 20:19   I have personally reviewed and evaluated these images and lab  results as part of my medical decision-making.   EKG Interpretation None      MDM   Final diagnoses:  Pain  Strain of lumbar paraspinal muscle, initial encounter    Patient presenting with symptoms most suggestive of musculoskeletal tenderness in the left paralumbar region that started yesterday. Pain is exacerbated by twisting and extreme flexion of the right hip. He has no midline spinal tenderness and no point tenderness of the iliac crest. Pain is right above the iliac crest. He has no abdominal symptoms he is able to eat and drink normally and has normal urination. UA is within normal limits. Low suspicion for any intra-abdominal pathology kidney stone.  Imaging of the right hip pending just to ensure no metastatic lesions in the right iliac crest or pelvis. If normal patient will be sent home with Flexeril.  8:26 PM Imaging neg.  I personally performed the services described in this documentation, which was scribed in my presence.  The recorded information has been reviewed and considered.     Blanchie Dessert, MD 05/21/15 5615  Blanchie Dessert, MD 05/21/15 2028

## 2015-05-21 NOTE — ED Notes (Signed)
Right flank "soreness" x2 days. Denies injury.  Denies urinary sx.

## 2015-05-21 NOTE — ED Notes (Signed)
Pt verbalizes understanding of d/c instructions and denies any further needs at this time. 

## 2015-06-05 ENCOUNTER — Encounter (HOSPITAL_BASED_OUTPATIENT_CLINIC_OR_DEPARTMENT_OTHER): Payer: Self-pay | Admitting: *Deleted

## 2015-06-05 ENCOUNTER — Emergency Department (HOSPITAL_BASED_OUTPATIENT_CLINIC_OR_DEPARTMENT_OTHER)
Admission: EM | Admit: 2015-06-05 | Discharge: 2015-06-05 | Disposition: A | Discharge status: Home / Self Care | Payer: Medicare Other | Attending: Emergency Medicine | Admitting: Emergency Medicine

## 2015-06-05 DIAGNOSIS — Z7951 Long term (current) use of inhaled steroids: Secondary | ICD-10-CM | POA: Diagnosis not present

## 2015-06-05 DIAGNOSIS — E119 Type 2 diabetes mellitus without complications: Secondary | ICD-10-CM | POA: Insufficient documentation

## 2015-06-05 DIAGNOSIS — I10 Essential (primary) hypertension: Secondary | ICD-10-CM | POA: Diagnosis not present

## 2015-06-05 DIAGNOSIS — Z79899 Other long term (current) drug therapy: Secondary | ICD-10-CM | POA: Diagnosis not present

## 2015-06-05 DIAGNOSIS — Z8546 Personal history of malignant neoplasm of prostate: Secondary | ICD-10-CM | POA: Diagnosis not present

## 2015-06-05 DIAGNOSIS — Z86718 Personal history of other venous thrombosis and embolism: Secondary | ICD-10-CM | POA: Diagnosis not present

## 2015-06-05 DIAGNOSIS — M545 Low back pain: Secondary | ICD-10-CM | POA: Diagnosis present

## 2015-06-05 DIAGNOSIS — Z8719 Personal history of other diseases of the digestive system: Secondary | ICD-10-CM | POA: Insufficient documentation

## 2015-06-05 DIAGNOSIS — Z7901 Long term (current) use of anticoagulants: Secondary | ICD-10-CM | POA: Insufficient documentation

## 2015-06-05 DIAGNOSIS — M25551 Pain in right hip: Secondary | ICD-10-CM | POA: Diagnosis not present

## 2015-06-05 DIAGNOSIS — M533 Sacrococcygeal disorders, not elsewhere classified: Secondary | ICD-10-CM

## 2015-06-05 DIAGNOSIS — E785 Hyperlipidemia, unspecified: Secondary | ICD-10-CM | POA: Diagnosis not present

## 2015-06-05 IMAGING — CT CT ABD-PELV W/ CM
2 of 8 series · 14 of 46 positions shown, 18 images · IV contrast (APPLIED)
Comparison: 03/12/2013

CLINICAL DATA: Upper abdominal pain since 1 am, initial encounter

EXAM:
CT ABDOMEN AND PELVIS WITH CONTRAST
TECHNIQUE: Multidetector CT imaging of the abdomen and pelvis was performed
using the standard protocol following bolus administration of
intravenous contrast.
CONTRAST:  100mL OMNIPAQUE IOHEXOL 300 MG/ML SOLN, 25mL OMNIPAQUE
IOHEXOL 300 MG/ML SOLN

[Series 2: abd/pelvis 5.0 b31f · axial · 0.85mm/px · z∈[+922,+1342]mm · 11 of 96 slices shown, 15 images]
[im 6/96  soft-tissue]
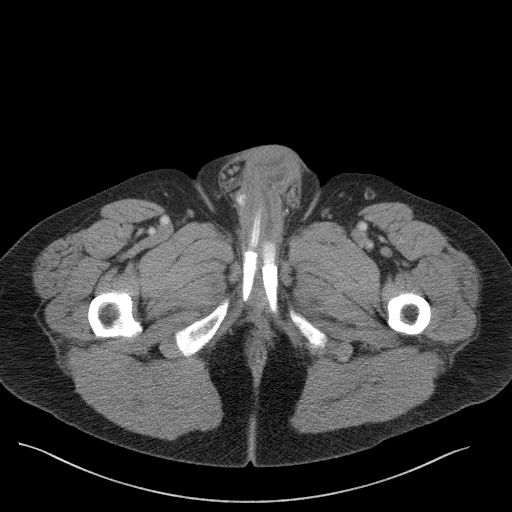
[im 6/96  bone]
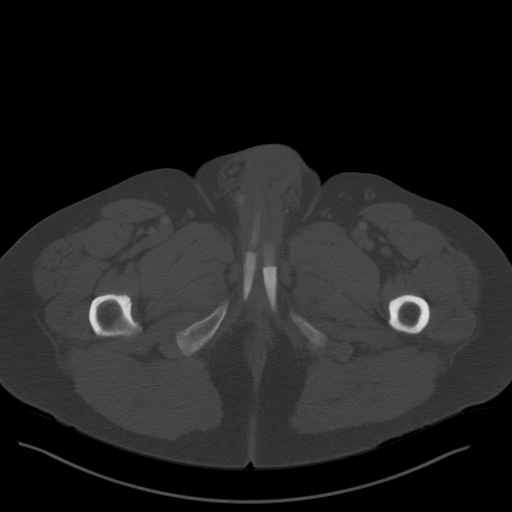
[im 17/96  soft-tissue]
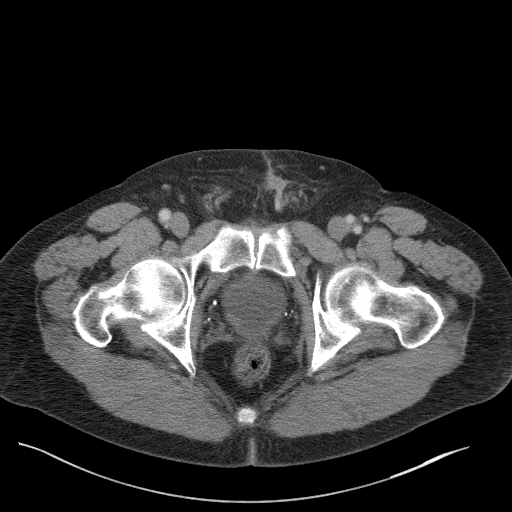
[im 28/96  soft-tissue]
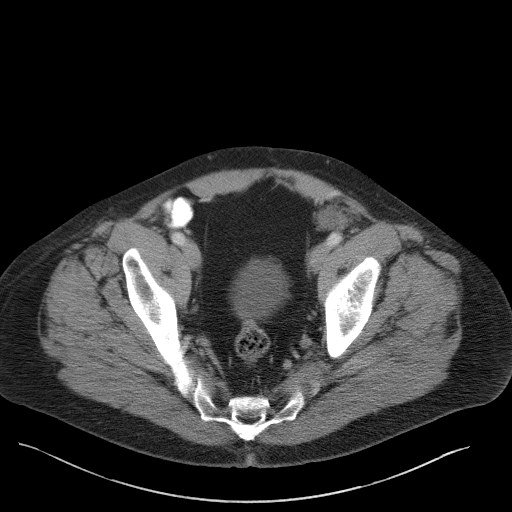
[im 40/96  soft-tissue]
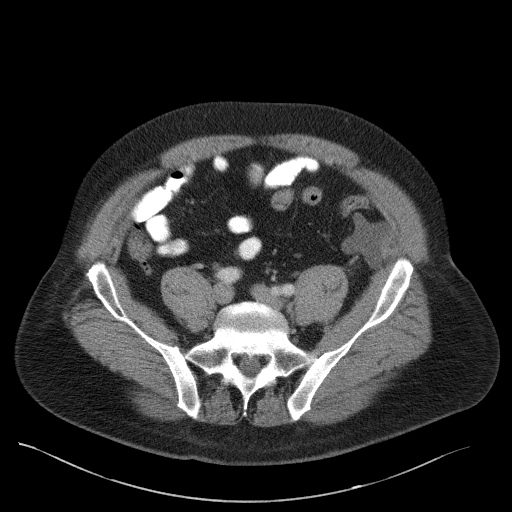
[im 51/96  soft-tissue]
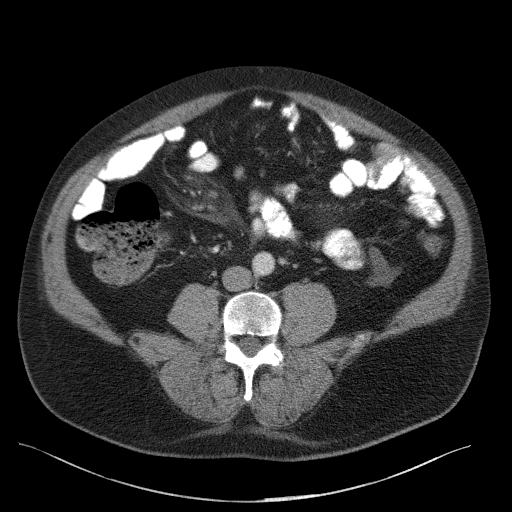
[im 56/96  soft-tissue]
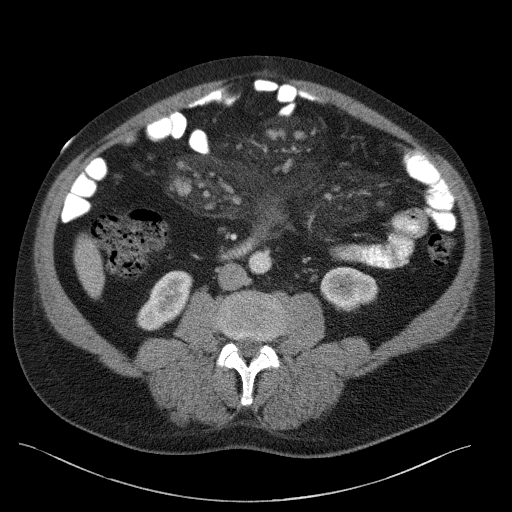
[im 68/96  soft-tissue]
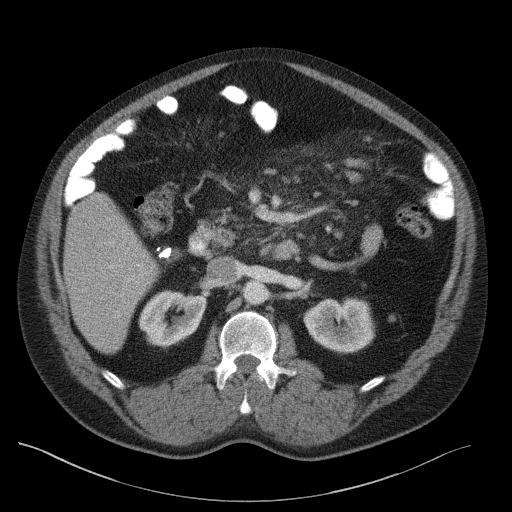
[im 73/96  lung]
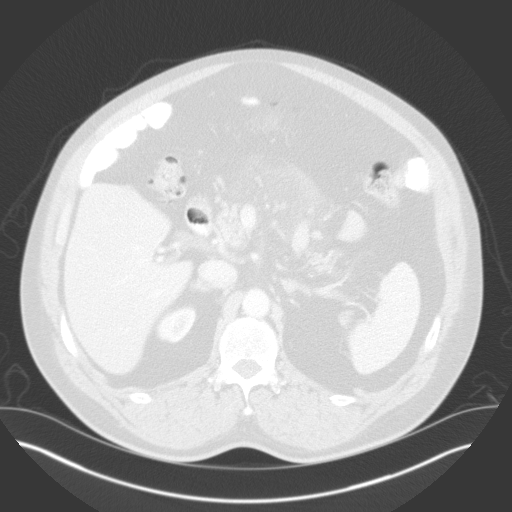
[im 79/96  soft-tissue]
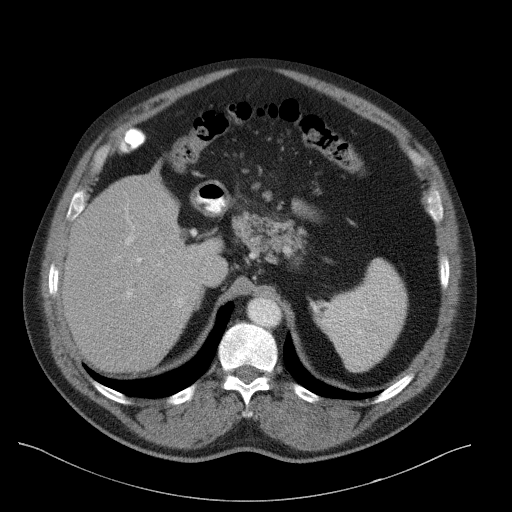
[im 79/96  lung]
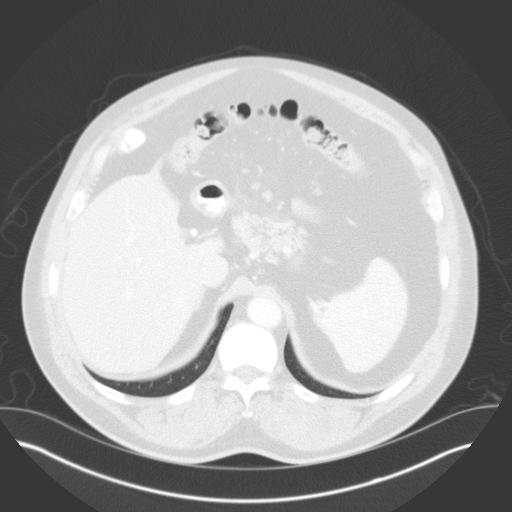
[im 84/96  lung]
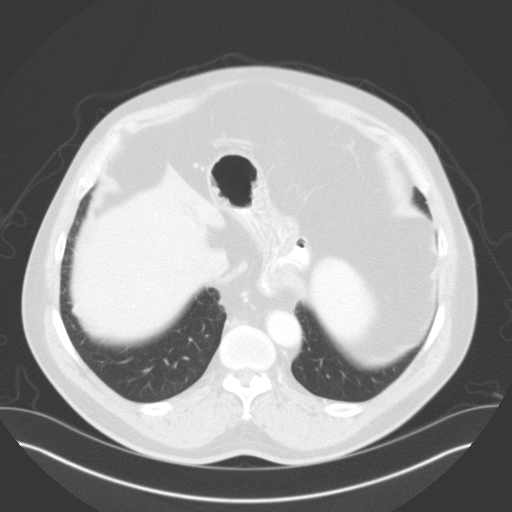
[im 90/96  soft-tissue]
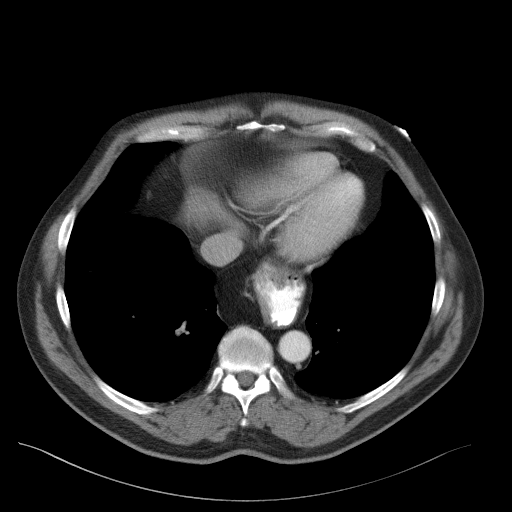
[im 90/96  lung]
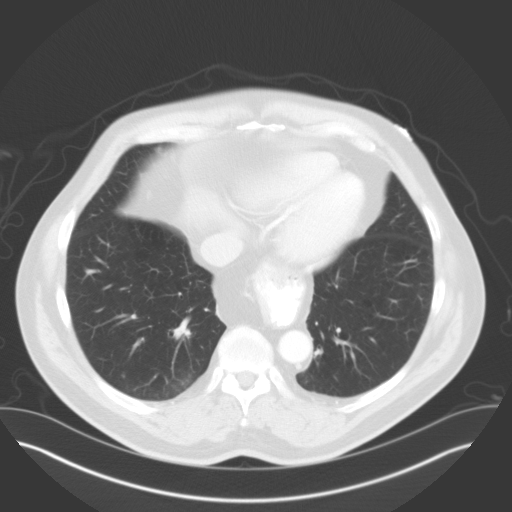
[im 90/96  bone]
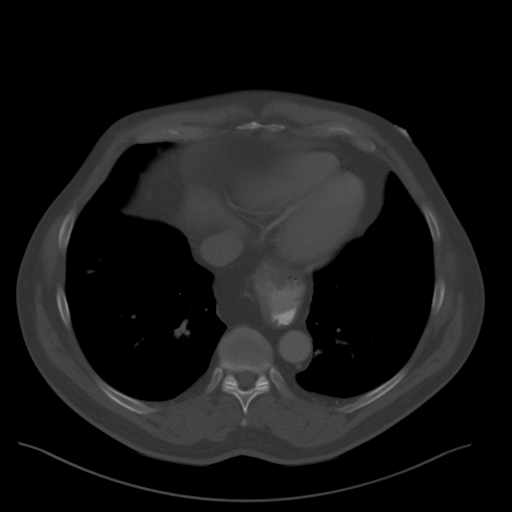

[Series 5: abd/pelvis 3.0 coronal · coronal · 0.92mm/px · 3 of 118 slices shown]
[im 24/118  soft-tissue]
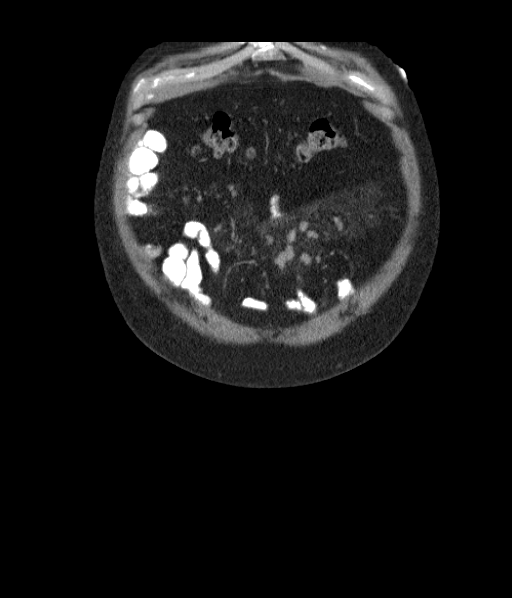
[im 47/118  soft-tissue]
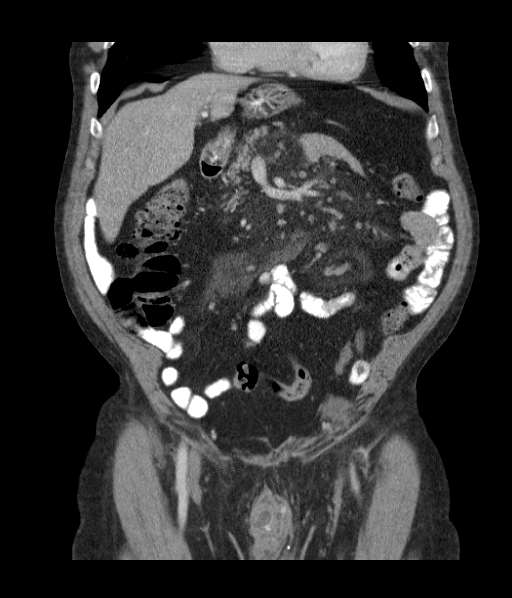
[im 71/118  soft-tissue]
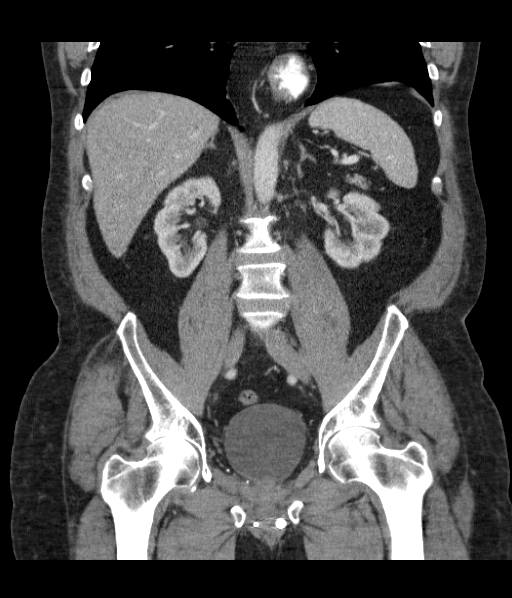

[14 of 46 positions shown; findings below may reference images not displayed]

FINDINGS: The lung bases are free of acute infiltrate or sizable effusion.
Stable nodules are noted in the right base consistent with a more
benign etiology. A small hiatal hernia is noted.

The liver, spleen, adrenal glands and kidneys are within normal
limits. The gallbladder is been surgically removed. The pancreas is
well visualized and within normal limits. Multiple small lymph nodes
and diffuse mesenteric edema is identified. The overall appearance
is similar to that seen on prior exam as well an exam from 4433.

The penile prosthesis is again noted with the reservoir in the left
mid abdomen. The appendix is well visualized. The bladder is well
distended. The previously seen postoperative changes in the pelvis
have resolved. Soft tissue density is again noted adjacent to the
left common femoral artery measuring 2.6 by 2.3 cm. It is stable
since 4433 and likely represents a more benign abnormality. No
significant pelvic or retroperitoneal adenopathy is seen. The bony
structures show no acute abnormality.
IMPRESSION: Stable right lower lobe nodules.

Stable edematous changes in the mesenteric with small lymph nodes.

Stable soft tissue density adjacent to the left common femoral
artery.

No acute abnormality is seen.

## 2015-06-05 MED ORDER — CYCLOBENZAPRINE HCL 10 MG PO TABS: 10.0000 mg | ORAL_TABLET | Freq: Two times a day (BID) | ORAL | Status: AC | PRN

## 2015-06-05 NOTE — ED Provider Notes (Addendum)
CSN: [id number]     Arrival date & time 06/05/15  0621 History   First MD Initiated Contact with Patient 06/05/15 [phone]     Chief Complaint  Patient presents with  . Hip Pain     (Consider location/radiation/quality/duration/timing/severity/associated sxs/prior Treatment) HPI  This is a 69 year old male who was seen here on the third of this month for lumbar pain. He was diagnosed with muscle strain and treated with Flexeril. The pain improved until yesterday when it returned. He denies injury or other inciting event. The pain is located about the right sacroiliac joint. He describes it as feeling like a pulled muscle. The pain is moderate and worse when he lifts his leg to put on his hands. There is some radiation to the right buttock but not the right groin or down the back of the right leg. He took a Ambulance person (which he has for pain resulting from past treatment of prostate cancer)  this morning with significant relief but is requesting a refill of the Flexeril which he states was very beneficial. There is no associated numbness or weakness. Radiographs taken on prior visit were unremarkable. Urinalysis on prior visit was unremarkable.  Past Medical History  Diagnosis Date  . Prostate cancer ([place])   . Hypertension   . Hyperlipidemia   . Hiatal hernia   . DVT (deep venous thrombosis) ([place])   . Diabetes mellitus without complication [facility])    Past Surgical History  Procedure Laterality Date  . Prostate surgery    . Cyst excision      from vocal cords  . Penile prosthesis implant    . Kidney stone surgery    . Throat surgery     No family history on file. Social History  Substance Use Topics  . Smoking status: Never Smoker   . Smokeless tobacco: Never Used  . Alcohol Use: Yes     Comment: rarely    Review of Systems  All other systems reviewed and are negative.   Allergies  Review of patient's allergies indicates no known allergies.  Home Medications   Prior to  Admission medications   Medication Sig Start Date End Date Taking? Authorizing Provider  atorvastatin (LIPITOR) 10 MG tablet Take 10 mg by mouth daily.    Historical Provider, MD  Cetirizine HCl 10 MG CAPS Take 1 capsule by mouth daily.    Historical Provider, MD  chlorthalidone (HYGROTON) 25 MG tablet Take 25 mg by mouth daily.    Historical Provider, MD  cyanocobalamin 500 MCG tablet Take 500 mcg by mouth daily.    Historical Provider, MD  cyclobenzaprine (FLEXERIL) 10 MG tablet Take 1 tablet (10 mg total) by mouth 2 (two) times daily as needed for muscle spasms. 05/21/15   Blanchie Dessert, MD  dicyclomine (BENTYL) 20 MG tablet Take 1 tablet (20 mg total) by mouth 2 (two) times daily. 07/12/14   Orpah Greek, MD  docusate sodium (COLACE) 100 MG capsule Take 1 capsule (100 mg total) by mouth every 12 (twelve) hours. 07/12/14   Orpah Greek, MD  esomeprazole (NEXIUM) 40 MG capsule Take 40 mg by mouth 2 (two) times daily.    Historical Provider, MD  fluticasone (FLONASE) 50 MCG/ACT nasal spray Place 2 sprays into both nostrils daily.    Historical Provider, MD  metoprolol (LOPRESSOR) 100 MG tablet Take 100 mg by mouth 2 (two) times daily.    Historical Provider, MD  oxyCODONE-acetaminophen (PERCOCET) 10-325 MG per tablet Take 1 tablet by mouth  every 6 (six) hours as needed for pain.    Historical Provider, MD  potassium chloride SA (K-DUR,KLOR-CON) 20 MEQ tablet Take 40 mEq by mouth 2 (two) times daily.    Historical Provider, MD  traMADol (ULTRAM) 50 MG tablet Take 50 mg by mouth every 8 (eight) hours as needed for pain.    Historical Provider, MD  warfarin (COUMADIN) 5 MG tablet Take by mouth daily. Take one and half tablets Monday, Wednesday, and Friday, then one tablet all other days.    Historical Provider, MD   BP 123/80 mmHg  Pulse 68  Temp(Src) 98.5 F (36.9 C) (Oral)  Resp 18  SpO2 98%   Physical Exam  General: Well-developed, well-nourished male in no acute  distress; appearance consistent with age of record HENT: normocephalic; atraumatic Eyes: pupils equal, round and reactive to light; extraocular muscles intact; arcus senilis bilaterally Neck: supple Heart: regular rate and rhythm Lungs: clear to auscultation bilaterally Abdomen: soft; nondistended Back: Mild tenderness of the right SI joint; no tenderness over the right greater trochanter; no tenderness of the right inferior pubic ramus; negative straight leg raise on the right Extremities: No deformity; full range of motion; pulses normal; no edema Neurologic: Awake, alert and oriented; motor function intact in all extremities and symmetric; no facial droop Skin: Warm and dry Psychiatric: Normal mood and affect    ED Course  Procedures (including critical care time)   MDM  We'll refill patient's Flexeril and refer him back to his PCP.  Shanon Rosser, MD 06/05/15 [phone]  Shanon Rosser, MD 06/05/15 [phone]

## 2015-06-05 NOTE — ED Notes (Signed)
C/o rt hip pain onset 2 weeks, was seen here, was feeling better ,  Last pm pain to rt hip returned

## 2015-06-05 NOTE — ED Notes (Addendum)
C/o rt hip pain x 2 weeks  Was seen here for same  Was getting better till last pm and pain returned,  Pt ambulatory  Slight limp

## 2015-08-30 ENCOUNTER — Encounter (HOSPITAL_BASED_OUTPATIENT_CLINIC_OR_DEPARTMENT_OTHER): Payer: Self-pay | Admitting: *Deleted

## 2015-08-30 ENCOUNTER — Emergency Department (HOSPITAL_BASED_OUTPATIENT_CLINIC_OR_DEPARTMENT_OTHER)
Admission: EM | Admit: 2015-08-30 | Discharge: 2015-08-30 | Disposition: A | Discharge status: Home / Self Care | Payer: Medicare Other | Attending: Emergency Medicine | Admitting: Emergency Medicine

## 2015-08-30 DIAGNOSIS — E119 Type 2 diabetes mellitus without complications: Secondary | ICD-10-CM | POA: Diagnosis not present

## 2015-08-30 DIAGNOSIS — Z7901 Long term (current) use of anticoagulants: Secondary | ICD-10-CM | POA: Diagnosis not present

## 2015-08-30 DIAGNOSIS — Z8719 Personal history of other diseases of the digestive system: Secondary | ICD-10-CM | POA: Insufficient documentation

## 2015-08-30 DIAGNOSIS — Z79899 Other long term (current) drug therapy: Secondary | ICD-10-CM | POA: Diagnosis not present

## 2015-08-30 DIAGNOSIS — Z8546 Personal history of malignant neoplasm of prostate: Secondary | ICD-10-CM | POA: Diagnosis not present

## 2015-08-30 DIAGNOSIS — I10 Essential (primary) hypertension: Secondary | ICD-10-CM | POA: Insufficient documentation

## 2015-08-30 DIAGNOSIS — Z86718 Personal history of other venous thrombosis and embolism: Secondary | ICD-10-CM | POA: Diagnosis not present

## 2015-08-30 DIAGNOSIS — Z7951 Long term (current) use of inhaled steroids: Secondary | ICD-10-CM | POA: Insufficient documentation

## 2015-08-30 DIAGNOSIS — E785 Hyperlipidemia, unspecified: Secondary | ICD-10-CM | POA: Insufficient documentation

## 2015-08-30 DIAGNOSIS — C8510 Unspecified B-cell lymphoma, unspecified site: Secondary | ICD-10-CM

## 2015-08-30 DIAGNOSIS — M545 Low back pain: Secondary | ICD-10-CM | POA: Diagnosis present

## 2015-08-30 DIAGNOSIS — C8330 Diffuse large B-cell lymphoma, unspecified site: Secondary | ICD-10-CM | POA: Insufficient documentation

## 2015-08-30 NOTE — ED Notes (Signed)
Patient preparing for discharge. 

## 2015-08-30 NOTE — ED Provider Notes (Signed)
CSN: [id number]     Arrival date & time 08/30/15  1650 History   First MD Initiated Contact with Patient 08/30/15 1822     Chief Complaint  Patient presents with  . Back Pain    HPI   Donald Zuniga is an 70 y.o. male with recent diagnosis of B-cell lymphoma who presents to the ED for evaluation of back pain. He has had back pain for the past several months which ultimately lead to finding of bone mets on lumbar spine MRI in 07/2015. He states he has heme/onc appointment tomorrow for PET scan in preparation to start chemotherapy. Pt states that he had accompanied his wife to her doctor's appointment this morning an apparently forgotten to take his home medications including pain medication. He states his pain flared up and he was worried something new was going on so he came to the ED. He states that while in the waiting room here he remembered he did not take his home meds and took them. States now in the room he feels completely better. He states his pain is controlled and has no complaints at this time. He denies new pain, fever, chills, n/v/d.   Past Medical History  Diagnosis Date  . Prostate cancer ([place])   . Hypertension   . Hyperlipidemia   . Hiatal hernia   . DVT (deep venous thrombosis) ([place])   . Diabetes mellitus without complication [facility])    Past Surgical History  Procedure Laterality Date  . Prostate surgery    . Cyst excision      from vocal cords  . Penile prosthesis implant    . Kidney stone surgery    . Throat surgery     No family history on file. Social History  Substance Use Topics  . Smoking status: Never Smoker   . Smokeless tobacco: Never Used  . Alcohol Use: Yes     Comment: rarely    Review of Systems  All other systems reviewed and are negative.     Allergies  Review of patient's allergies indicates no known allergies.  Home Medications   Prior to Admission medications   Medication Sig Start Date End Date Taking? Authorizing Provider   atorvastatin (LIPITOR) 10 MG tablet Take 10 mg by mouth daily.    Historical Provider, MD  Cetirizine HCl 10 MG CAPS Take 1 capsule by mouth daily.    Historical Provider, MD  chlorthalidone (HYGROTON) 25 MG tablet Take 25 mg by mouth daily.    Historical Provider, MD  cyanocobalamin 500 MCG tablet Take 500 mcg by mouth daily.    Historical Provider, MD  cyclobenzaprine (FLEXERIL) 10 MG tablet Take 1 tablet (10 mg total) by mouth 2 (two) times daily as needed for muscle spasms. 06/05/15   Donald Molpus, MD  dicyclomine (BENTYL) 20 MG tablet Take 1 tablet (20 mg total) by mouth 2 (two) times daily. 07/12/14   Donald Greek, MD  docusate sodium (COLACE) 100 MG capsule Take 1 capsule (100 mg total) by mouth every 12 (twelve) hours. 07/12/14   Donald Greek, MD  esomeprazole (NEXIUM) 40 MG capsule Take 40 mg by mouth 2 (two) times daily.    Historical Provider, MD  fluticasone (FLONASE) 50 MCG/ACT nasal spray Place 2 sprays into both nostrils daily.    Historical Provider, MD  metoprolol (LOPRESSOR) 100 MG tablet Take 100 mg by mouth 2 (two) times daily.    Historical Provider, MD  oxyCODONE-acetaminophen (PERCOCET) 10-325 MG per tablet Take 1  tablet by mouth every 6 (six) hours as needed for pain.    Historical Provider, MD  potassium chloride SA (K-DUR,KLOR-CON) 20 MEQ tablet Take 40 mEq by mouth 2 (two) times daily.    Historical Provider, MD  traMADol (ULTRAM) 50 MG tablet Take 50 mg by mouth every 8 (eight) hours as needed for pain.    Historical Provider, MD  warfarin (COUMADIN) 5 MG tablet Take by mouth daily. Take one and half tablets Monday, Wednesday, and Friday, then one tablet all other days.    Historical Provider, MD   BP 135/82 mmHg  Pulse 56  Temp(Src) 97.9 F (36.6 C) (Oral)  Resp 18  Ht 5\' 11"  (1.803 m)  Wt 112.946 kg  BMI 34.74 kg/m2  SpO2 95% Physical Exam  Constitutional: He is oriented to person, place, and time. No distress.  HENT:  Right Ear: External  ear normal.  Left Ear: External ear normal.  Mouth/Throat: Oropharynx is clear and moist.  Eyes: Conjunctivae and EOM are normal. No scleral icterus.  Neck: Normal range of motion.  Cardiovascular: Normal rate, regular rhythm and normal heart sounds.   Pulmonary/Chest: Effort normal. No respiratory distress. He exhibits no tenderness.  Abdominal: Soft. He exhibits no distension. There is no tenderness.  Musculoskeletal: Normal range of motion. He exhibits no edema or tenderness.  Neurological: He is alert and oriented to person, place, and time.  Skin: Skin is warm and dry. He is not diaphoretic.  Psychiatric: He has a normal mood and affect. His behavior is normal.  Nursing note and vitals reviewed.   ED Course  Procedures (including critical care time) Labs Review Labs Reviewed - No data to display  Imaging Review No results found. I have personally reviewed and evaluated these images and lab results as part of my medical decision-making.   EKG Interpretation None      MDM   Final diagnoses:  B-cell lymphoma, unspecified B-cell lymphoma type, unspecified body region [facility])    Pt with recently diagnosed metastatic b cell lymphoma who originally came to the ED for evaluation of back pain. That pain has now resolved as pt realized he did not take his home medications this morning and took them while in the waiting room. He has no new complaints. His exam is nonfocal. He has heme/onc follow up tomorrow. Instructed to f/u as scheduled. ER return precautions given.     Anne Ng, Hershal Coria 08/30/15 2113  Leonard Schwartz, MD 09/05/15 [phone]

## 2015-08-30 NOTE — ED Notes (Signed)
Pain in his right upper back. States 2 weeks ago he was diagnosed with bone cancer and he is seeing a cancer doctor but has not started chemo. He took a Percocet for the pain 4 hours ago with not much relief.

## 2015-08-30 NOTE — Discharge Instructions (Signed)
You were seen in the emergency room today for back pain and rib pain. Your pain went away after you took your home medications. You otherwise had no new symptoms and no new or concerning findings on physical exam. Please follow-up with your doctors tomorrow as scheduled for ongoing treatment. Return to the ER for new or worsening symptoms.

## 2015-11-06 ENCOUNTER — Encounter (HOSPITAL_BASED_OUTPATIENT_CLINIC_OR_DEPARTMENT_OTHER): Payer: Self-pay | Admitting: Emergency Medicine

## 2015-11-06 DIAGNOSIS — R079 Chest pain, unspecified: Secondary | ICD-10-CM | POA: Diagnosis not present

## 2015-11-06 DIAGNOSIS — Z8719 Personal history of other diseases of the digestive system: Secondary | ICD-10-CM | POA: Diagnosis not present

## 2015-11-06 DIAGNOSIS — Z7951 Long term (current) use of inhaled steroids: Secondary | ICD-10-CM | POA: Diagnosis not present

## 2015-11-06 DIAGNOSIS — Z79899 Other long term (current) drug therapy: Secondary | ICD-10-CM | POA: Insufficient documentation

## 2015-11-06 DIAGNOSIS — E785 Hyperlipidemia, unspecified: Secondary | ICD-10-CM | POA: Insufficient documentation

## 2015-11-06 DIAGNOSIS — Z86718 Personal history of other venous thrombosis and embolism: Secondary | ICD-10-CM | POA: Insufficient documentation

## 2015-11-06 DIAGNOSIS — Z7901 Long term (current) use of anticoagulants: Secondary | ICD-10-CM | POA: Insufficient documentation

## 2015-11-06 DIAGNOSIS — Z8546 Personal history of malignant neoplasm of prostate: Secondary | ICD-10-CM | POA: Insufficient documentation

## 2015-11-06 DIAGNOSIS — E119 Type 2 diabetes mellitus without complications: Secondary | ICD-10-CM | POA: Insufficient documentation

## 2015-11-06 DIAGNOSIS — I10 Essential (primary) hypertension: Secondary | ICD-10-CM | POA: Insufficient documentation

## 2015-11-06 DIAGNOSIS — T496X5A Adverse effect of otorhinolaryngological drugs and preparations, initial encounter: Secondary | ICD-10-CM | POA: Insufficient documentation

## 2015-11-06 NOTE — ED Notes (Signed)
Patient reports that he is having some burning pain to his chest and epigastric region r/t to the magic mouth wash that they gave him for his mouth ulcers. The patient point to his whole chest, epigastric region and up and down from umbilical area to neck

## 2015-11-07 ENCOUNTER — Emergency Department (HOSPITAL_BASED_OUTPATIENT_CLINIC_OR_DEPARTMENT_OTHER)
Admission: EM | Admit: 2015-11-07 | Discharge: 2015-11-07 | Disposition: A | Discharge status: Home / Self Care | Payer: Medicare Other | Attending: Emergency Medicine | Admitting: Emergency Medicine

## 2015-11-07 DIAGNOSIS — T50905A Adverse effect of unspecified drugs, medicaments and biological substances, initial encounter: Secondary | ICD-10-CM

## 2015-11-07 LAB — CBC WITH DIFFERENTIAL/PLATELET
BAND NEUTROPHILS: 2 %
BASOS ABS: 0 10*3/uL (ref 0.0–0.1)
BLASTS: 0 %
Basophils Relative: 0 %
EOS ABS: 0 10*3/uL (ref 0.0–0.7)
Eosinophils Relative: 0 %
HCT: 29.4 % — ABNORMAL LOW (ref 39.0–52.0)
HEMOGLOBIN: 9.3 g/dL — AB (ref 13.0–17.0)
Lymphocytes Relative: 2 %
Lymphs Abs: 0.2 10*3/uL — ABNORMAL LOW (ref 0.7–4.0)
MCH: 25.8 pg — ABNORMAL LOW (ref 26.0–34.0)
MCHC: 31.6 g/dL (ref 30.0–36.0)
MCV: 81.4 fL (ref 78.0–100.0)
METAMYELOCYTES PCT: 0 %
MONOS PCT: 1 %
MYELOCYTES: 1 %
Monocytes Absolute: 0.1 10*3/uL (ref 0.1–1.0)
NEUTROS ABS: 11.1 10*3/uL — AB (ref 1.7–7.7)
Neutrophils Relative %: 94 %
Other: 0 %
Platelets: 101 10*3/uL — ABNORMAL LOW (ref 150–400)
Promyelocytes Absolute: 0 %
RBC: 3.61 MIL/uL — ABNORMAL LOW (ref 4.22–5.81)
RDW: 20.9 % — AB (ref 11.5–15.5)
WBC: 11.4 10*3/uL — ABNORMAL HIGH (ref 4.0–10.5)
nRBC: 0 /100 WBC

## 2015-11-07 LAB — BASIC METABOLIC PANEL
Anion gap: 9 (ref 5–15)
BUN: 16 mg/dL (ref 6–20)
CALCIUM: 8.6 mg/dL — AB (ref 8.9–10.3)
CHLORIDE: 99 mmol/L — AB (ref 101–111)
CO2: 29 mmol/L (ref 22–32)
CREATININE: 1.12 mg/dL (ref 0.61–1.24)
GFR calc non Af Amer: >60 mL/min (ref >60)
Glucose, Bld: 127 mg/dL — ABNORMAL HIGH (ref 65–99)
Potassium: 3.4 mmol/L — ABNORMAL LOW (ref 3.5–5.1)
SODIUM: 137 mmol/L (ref 135–145)

## 2015-11-07 LAB — TROPONIN I

## 2015-11-07 MED ORDER — ONDANSETRON 8 MG PO TBDP
8.0000 mg | ORAL_TABLET | Freq: Once | ORAL | Status: AC
  Administered 2015-11-07: 8 mg via ORAL
  Filled 2015-11-07: qty 1

## 2015-11-07 NOTE — ED Notes (Signed)
C/o chest pain onset last pm radiating to rt ear onset after using magic mouth wash for mouth ulcers

## 2015-11-07 NOTE — Discharge Instructions (Signed)
If the "magic mouthwash" is causing you chest discomfort after swallowing it you should try gargling it and then spitting out. You should consult the physician who prescribed it and let them know that you had this reaction.

## 2015-11-07 NOTE — ED Provider Notes (Signed)
CSN: [id number]     Arrival date & time 11/06/15  2157 History   First MD Initiated Contact with Patient 11/07/15 [phone]     Chief Complaint  Patient presents with  . Chest Pain     (Consider location/radiation/quality/duration/timing/severity/associated sxs/prior Treatment) HPI  This is a 70 year old male with prostate cancer who recently completed a round of chemotherapy. As a result of the chemotherapy he developed painful intraoral ulcerations. He was prescribed "magic mouthwash" yesterday. After taking his first dose yesterday evening he subsequently developed pain from his epigastrium up into his throat which she describes as feeling like previous acid reflux. His symptoms were severe enough to scare him and he came to the ED for reevaluation. His symptoms of subsequently improved although he continues to be nauseated. He is not having any shortness of breath. He is already on a PPI for acid reflux.  Past Medical History  Diagnosis Date  . Prostate cancer ([place])   . Hypertension   . Hyperlipidemia   . Hiatal hernia   . DVT (deep venous thrombosis) ([place])   . Diabetes mellitus without complication [facility])    Past Surgical History  Procedure Laterality Date  . Prostate surgery    . Cyst excision      from vocal cords  . Penile prosthesis implant    . Kidney stone surgery    . Throat surgery     History reviewed. No pertinent family history. Social History  Substance Use Topics  . Smoking status: Never Smoker   . Smokeless tobacco: Never Used  . Alcohol Use: Yes     Comment: rarely    Review of Systems  All other systems reviewed and are negative.   Allergies  Review of patient's allergies indicates no known allergies.  Home Medications   Prior to Admission medications   Medication Sig Start Date End Date Taking? Authorizing Provider  atorvastatin (LIPITOR) 10 MG tablet Take 10 mg by mouth daily.    Historical Provider, MD  Cetirizine HCl 10 MG CAPS Take 1 capsule by  mouth daily.    Historical Provider, MD  chlorthalidone (HYGROTON) 25 MG tablet Take 25 mg by mouth daily.    Historical Provider, MD  cyanocobalamin 500 MCG tablet Take 500 mcg by mouth daily.    Historical Provider, MD  cyclobenzaprine (FLEXERIL) 10 MG tablet Take 1 tablet (10 mg total) by mouth 2 (two) times daily as needed for muscle spasms. 06/05/15   Angenette Daily, MD  dicyclomine (BENTYL) 20 MG tablet Take 1 tablet (20 mg total) by mouth 2 (two) times daily. 07/12/14   Orpah Greek, MD  docusate sodium (COLACE) 100 MG capsule Take 1 capsule (100 mg total) by mouth every 12 (twelve) hours. 07/12/14   Orpah Greek, MD  esomeprazole (NEXIUM) 40 MG capsule Take 40 mg by mouth 2 (two) times daily.    Historical Provider, MD  fluticasone (FLONASE) 50 MCG/ACT nasal spray Place 2 sprays into both nostrils daily.    Historical Provider, MD  metoprolol (LOPRESSOR) 100 MG tablet Take 100 mg by mouth 2 (two) times daily.    Historical Provider, MD  oxyCODONE-acetaminophen (PERCOCET) 10-325 MG per tablet Take 1 tablet by mouth every 6 (six) hours as needed for pain.    Historical Provider, MD  potassium chloride SA (K-DUR,KLOR-CON) 20 MEQ tablet Take 40 mEq by mouth 2 (two) times daily.    Historical Provider, MD  traMADol (ULTRAM) 50 MG tablet Take 50 mg by mouth every 8 (  eight) hours as needed for pain.    Historical Provider, MD  warfarin (COUMADIN) 5 MG tablet Take by mouth daily. Take one and half tablets Monday, Wednesday, and Friday, then one tablet all other days.    Historical Provider, MD   BP 116/80 mmHg  Pulse 88  Temp(Src) 98.2 F (36.8 C) (Oral)  Resp 18  Ht 5\' 11"  (1.803 m)  Wt 232 lb (105.235 kg)  BMI 32.37 kg/m2  SpO2 100%   Physical Exam  General: Well-developed, well-nourished male in no acute distress; appearance consistent with age of record HENT: normocephalic; atraumatic; shallow intraoral ulcerations Eyes: pupils equal, round and reactive to light;  extraocular muscles intact Neck: supple Heart: regular rate and rhythm Lungs: clear to auscultation bilaterally Abdomen: soft; nondistended; nontender; bowel sounds present Extremities: No deformity; full range of motion; pulses normal Neurologic: Awake, alert and oriented; motor function intact in all extremities and symmetric; no facial droop Skin: Warm and dry Psychiatric: Normal mood and affect    ED Course  Procedures (including critical care time)    EKG Interpretation   Date/Time:  Tuesday November 06 2015 23:04:46 EDT Ventricular Rate:  74 PR Interval:  172 QRS Duration: 76 QT Interval:  370 QTC Calculation: 410 R Axis:   41 Text Interpretation:  Normal sinus rhythm Normal ECG Rate is faster  Confirmed by Pricilla Moehle  MD, Jenny Reichmann ([id number]) on 11/07/2015 12:28:06 AM      MDM   Nursing notes and vitals signs, including pulse oximetry, reviewed.  Summary of this visit's results, reviewed by myself:  Labs:  Results for orders placed or performed during the hospital encounter of 11/07/15 (from the past 24 hour(s))  CBC with Differential     Status: Abnormal   Collection Time: 11/07/15  3:55 AM  Result Value Ref Range   WBC 11.4 (H) 4.0 - 10.5 K/uL   RBC 3.61 (L) 4.22 - 5.81 MIL/uL   Hemoglobin 9.3 (L) 13.0 - 17.0 g/dL   HCT 29.4 (L) 39.0 - 52.0 %   MCV 81.4 78.0 - 100.0 fL   MCH 25.8 (L) 26.0 - 34.0 pg   MCHC 31.6 30.0 - 36.0 g/dL   RDW 20.9 (H) 11.5 - 15.5 %   Platelets 101 (L) 150 - 400 K/uL   Neutrophils Relative % 94 %   Lymphocytes Relative 2 %   Monocytes Relative 1 %   Eosinophils Relative 0 %   Basophils Relative 0 %   Band Neutrophils 2 %   Metamyelocytes Relative 0 %   Myelocytes 1 %   Promyelocytes Absolute 0 %   Blasts 0 %   nRBC 0 0 /100 WBC   Other 0 %   Neutro Abs 11.1 (H) 1.7 - 7.7 K/uL   Lymphs Abs 0.2 (L) 0.7 - 4.0 K/uL   Monocytes Absolute 0.1 0.1 - 1.0 K/uL   Eosinophils Absolute 0.0 0.0 - 0.7 K/uL   Basophils Absolute 0.0 0.0 - 0.1 K/uL    RBC Morphology TEARDROP CELLS    WBC Morphology MILD LEFT SHIFT (1-5% METAS, OCC MYELO, OCC BANDS)   Basic metabolic panel     Status: Abnormal   Collection Time: 11/07/15  3:55 AM  Result Value Ref Range   Sodium 137 135 - 145 mmol/L   Potassium 3.4 (L) 3.5 - 5.1 mmol/L   Chloride 99 (L) 101 - 111 mmol/L   CO2 29 22 - 32 mmol/L   Glucose, Bld 127 (H) 65 - 99 mg/dL   BUN  16 6 - 20 mg/dL   Creatinine, Ser 1.12 0.61 - 1.24 mg/dL   Calcium 8.6 (L) 8.9 - 10.3 mg/dL   GFR calc non Af Amer >60 >60 mL/min   GFR calc Af Amer >60 >60 mL/min   Anion gap 9 5 - 15  Troponin I     Status: None   Collection Time: 11/07/15  3:55 AM  Result Value Ref Range   Troponin I <0.03 <0.031 ng/mL   The patient was advised to gargle and spit out his Magic mouthwash and to contact the prescriber to let them know he had an adverse reaction.     Shanon Rosser, MD 11/07/15 [phone]

## 2016-01-17 DEATH — deceased

## 2016-04-13 IMAGING — CR DG HIP (WITH OR WITHOUT PELVIS) 2-3V*R*
3 series · 3 of 3 positions shown · non-contrast
Comparison: None.

CLINICAL DATA: Nontraumatic right hip pain for 2 days.

EXAM:
DG HIP (WITH OR WITHOUT PELVIS) 2-3V RIGHT

[t pelvis a.p.]
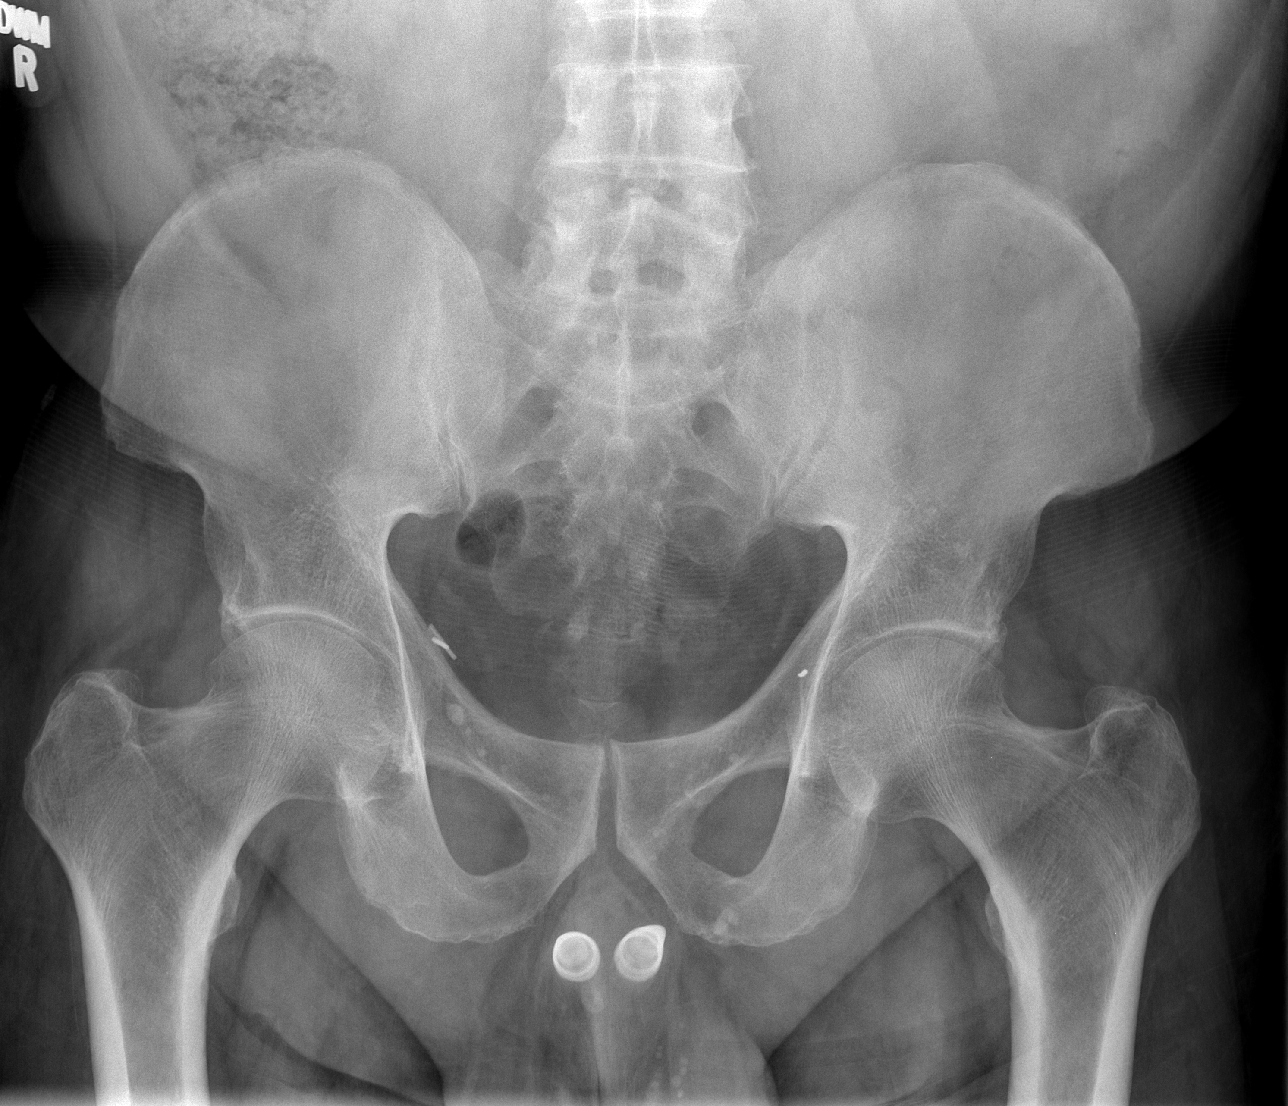

[t hip ap right]
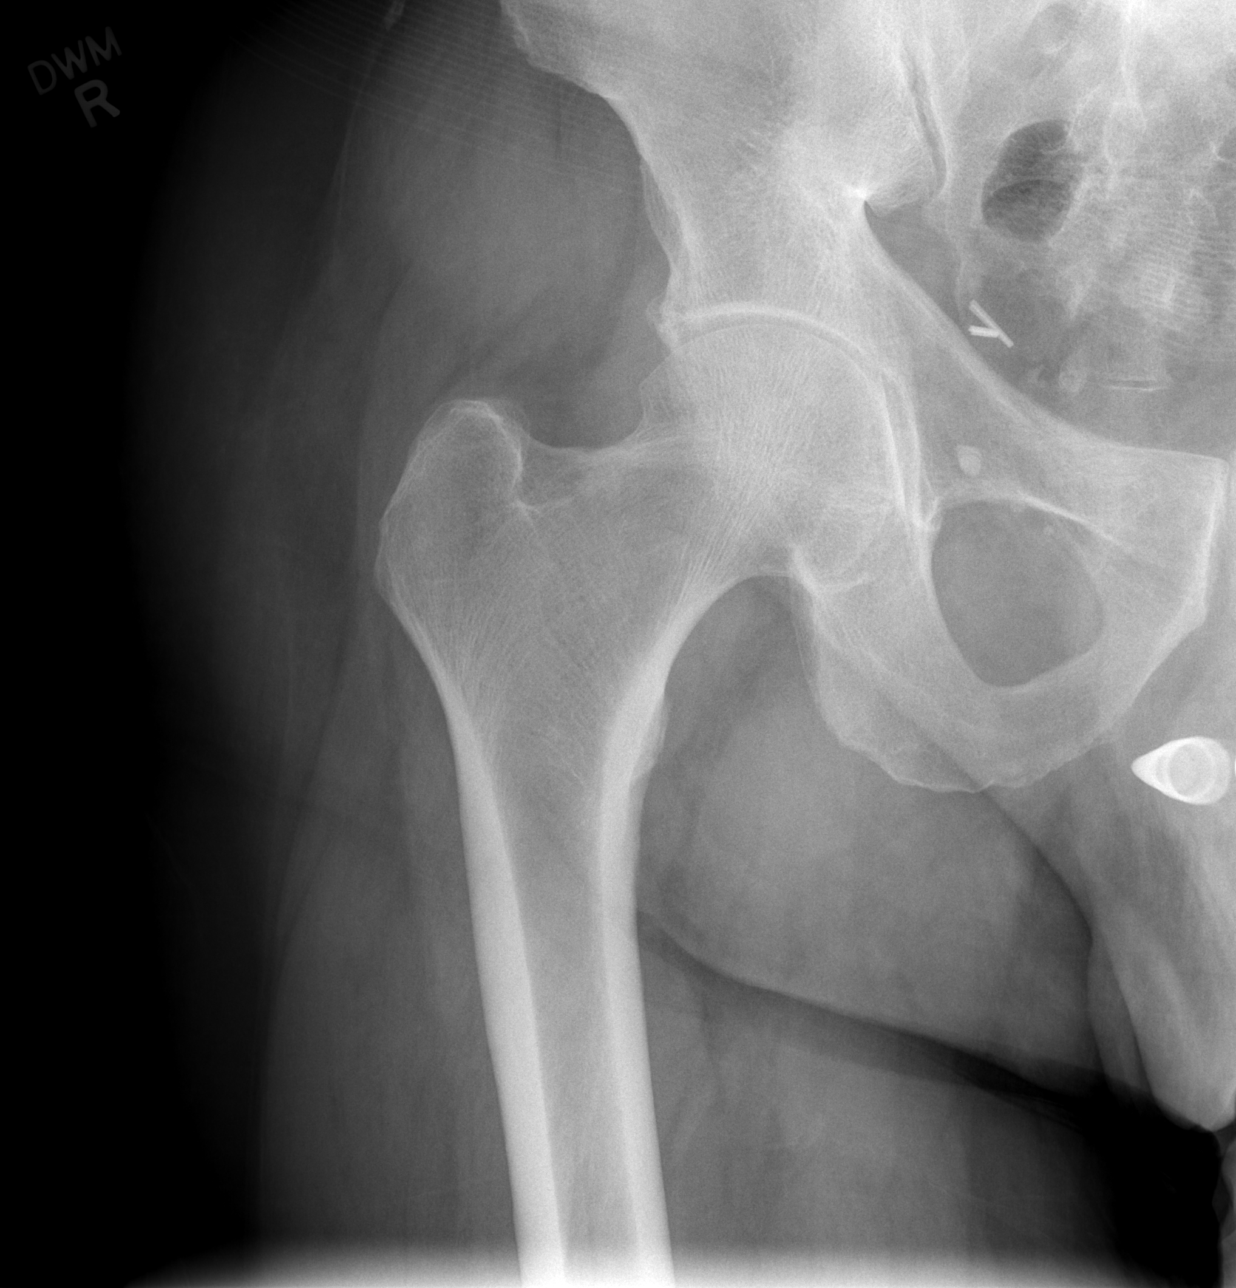

[t hip frog leg right]
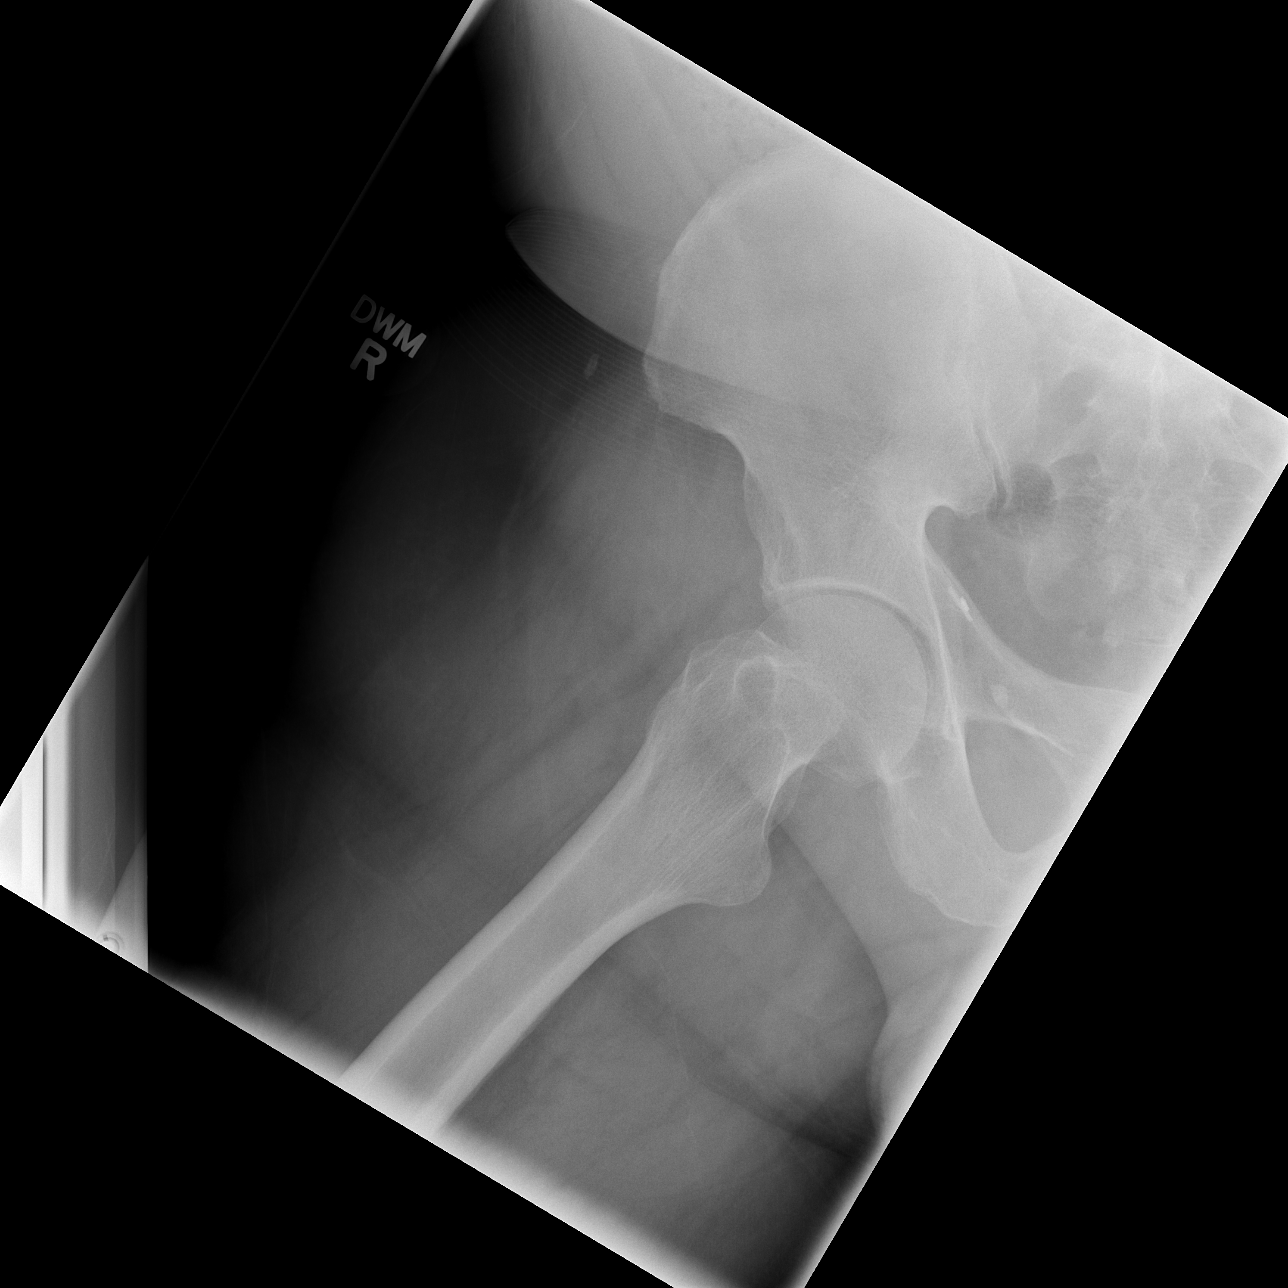

[3 of 3 positions shown; findings below may reference images not displayed]

FINDINGS: There is no evidence of hip fracture or dislocation. There is no
evidence of arthropathy or other focal bone abnormality.
IMPRESSION: Negative.
